# Patient Record
Sex: Female | Born: 1945 | Race: White | Hispanic: No | Marital: Married | Smoking: Never smoker
Health system: Southern US, Community
[De-identification: ages and names within clinical notes are randomized; demographics above are authoritative.]

## PROBLEM LIST (undated history)

## (undated) DIAGNOSIS — N200 Calculus of kidney: Secondary | ICD-10-CM

## (undated) DIAGNOSIS — R52 Pain, unspecified: Secondary | ICD-10-CM

## (undated) DIAGNOSIS — Z9889 Other specified postprocedural states: Secondary | ICD-10-CM

## (undated) DIAGNOSIS — E039 Hypothyroidism, unspecified: Secondary | ICD-10-CM

## (undated) DIAGNOSIS — K219 Gastro-esophageal reflux disease without esophagitis: Secondary | ICD-10-CM

## (undated) DIAGNOSIS — I10 Essential (primary) hypertension: Secondary | ICD-10-CM

## (undated) DIAGNOSIS — R112 Nausea with vomiting, unspecified: Secondary | ICD-10-CM

## (undated) DIAGNOSIS — Z87442 Personal history of urinary calculi: Secondary | ICD-10-CM

## (undated) DIAGNOSIS — E785 Hyperlipidemia, unspecified: Secondary | ICD-10-CM

## (undated) DIAGNOSIS — T4145XA Adverse effect of unspecified anesthetic, initial encounter: Secondary | ICD-10-CM

## (undated) DIAGNOSIS — N201 Calculus of ureter: Secondary | ICD-10-CM

## (undated) DIAGNOSIS — M1990 Unspecified osteoarthritis, unspecified site: Secondary | ICD-10-CM

## (undated) DIAGNOSIS — F419 Anxiety disorder, unspecified: Secondary | ICD-10-CM

## (undated) DIAGNOSIS — T8859XA Other complications of anesthesia, initial encounter: Secondary | ICD-10-CM

## (undated) HISTORY — PX: ABDOMINAL HYSTERECTOMY: SHX81

## (undated) HISTORY — PX: TUBAL LIGATION: SHX77

---
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
## 1976-02-08 HISTORY — PX: ECTOPIC PREGNANCY SURGERY: SHX613

## 1978-02-07 HISTORY — PX: DIAGNOSTIC LAPAROSCOPY: SUR761

## 1997-10-01 ENCOUNTER — Other Ambulatory Visit: Admission: RE | Admit: 1997-10-01 | Discharge: 1997-10-01 | Payer: Self-pay | Admitting: Obstetrics and Gynecology

## 1998-02-07 HISTORY — PX: TOTAL ABDOMINAL HYSTERECTOMY W/ BILATERAL SALPINGOOPHORECTOMY: SHX83

## 1998-05-04 ENCOUNTER — Inpatient Hospital Stay (HOSPITAL_COMMUNITY): Admission: RE | Admit: 1998-05-04 | Discharge: 1998-05-06 | Payer: Self-pay | Admitting: Obstetrics and Gynecology

## 1998-11-24 ENCOUNTER — Encounter: Payer: Self-pay | Admitting: Emergency Medicine

## 1998-11-24 ENCOUNTER — Emergency Department (HOSPITAL_COMMUNITY): Admission: EM | Admit: 1998-11-24 | Discharge: 1998-11-24 | Payer: Self-pay | Admitting: Emergency Medicine

## 2000-09-27 ENCOUNTER — Ambulatory Visit (HOSPITAL_COMMUNITY): Admission: RE | Admit: 2000-09-27 | Discharge: 2000-09-27 | Payer: Self-pay | Admitting: Family Medicine

## 2000-09-27 ENCOUNTER — Encounter: Payer: Self-pay | Admitting: Family Medicine

## 2001-03-19 ENCOUNTER — Encounter (HOSPITAL_COMMUNITY): Admission: RE | Admit: 2001-03-19 | Discharge: 2001-04-18 | Payer: Self-pay | Admitting: Orthopedic Surgery

## 2001-08-01 ENCOUNTER — Other Ambulatory Visit: Admission: RE | Admit: 2001-08-01 | Discharge: 2001-08-01 | Payer: Self-pay | Admitting: Family Medicine

## 2001-08-23 ENCOUNTER — Encounter: Payer: Self-pay | Admitting: Family Medicine

## 2001-08-23 ENCOUNTER — Observation Stay (HOSPITAL_COMMUNITY): Admission: AD | Admit: 2001-08-23 | Discharge: 2001-08-24 | Payer: Self-pay | Admitting: Family Medicine

## 2002-06-20 ENCOUNTER — Ambulatory Visit (HOSPITAL_COMMUNITY): Admission: RE | Admit: 2002-06-20 | Discharge: 2002-06-20 | Payer: Self-pay | Admitting: Family Medicine

## 2002-06-20 ENCOUNTER — Encounter: Payer: Self-pay | Admitting: Family Medicine

## 2003-01-09 ENCOUNTER — Encounter: Admission: RE | Admit: 2003-01-09 | Discharge: 2003-01-09 | Payer: Self-pay | Admitting: Obstetrics and Gynecology

## 2004-08-11 ENCOUNTER — Ambulatory Visit (HOSPITAL_COMMUNITY): Admission: RE | Admit: 2004-08-11 | Discharge: 2004-08-11 | Payer: Self-pay | Admitting: Family Medicine

## 2006-11-01 ENCOUNTER — Encounter (HOSPITAL_COMMUNITY): Admission: RE | Admit: 2006-11-01 | Discharge: 2006-11-07 | Payer: Self-pay | Admitting: Orthopedic Surgery

## 2006-11-08 ENCOUNTER — Encounter (HOSPITAL_COMMUNITY): Admission: RE | Admit: 2006-11-08 | Discharge: 2006-12-08 | Payer: Self-pay | Admitting: Orthopedic Surgery

## 2007-01-02 ENCOUNTER — Ambulatory Visit (HOSPITAL_COMMUNITY): Admission: RE | Admit: 2007-01-02 | Discharge: 2007-01-02 | Payer: Self-pay | Admitting: Family Medicine

## 2007-02-07 ENCOUNTER — Ambulatory Visit (HOSPITAL_COMMUNITY): Admission: RE | Admit: 2007-02-07 | Discharge: 2007-02-07 | Payer: Self-pay | Admitting: Family Medicine

## 2007-05-14 ENCOUNTER — Ambulatory Visit (HOSPITAL_COMMUNITY): Admission: RE | Admit: 2007-05-14 | Discharge: 2007-05-14 | Payer: Self-pay | Admitting: Family Medicine

## 2008-09-09 ENCOUNTER — Emergency Department (HOSPITAL_COMMUNITY): Admission: EM | Admit: 2008-09-09 | Discharge: 2008-09-09 | Payer: Self-pay | Admitting: Emergency Medicine

## 2008-09-15 ENCOUNTER — Ambulatory Visit (HOSPITAL_COMMUNITY): Admission: RE | Admit: 2008-09-15 | Discharge: 2008-09-15 | Payer: Self-pay | Admitting: Internal Medicine

## 2009-03-14 ENCOUNTER — Emergency Department (HOSPITAL_COMMUNITY): Admission: EM | Admit: 2009-03-14 | Discharge: 2009-03-14 | Payer: Self-pay | Admitting: Emergency Medicine

## 2010-02-28 ENCOUNTER — Emergency Department (HOSPITAL_COMMUNITY)
Admission: EM | Admit: 2010-02-28 | Discharge: 2010-02-28 | Payer: Self-pay | Source: Home / Self Care | Admitting: Emergency Medicine

## 2010-02-28 ENCOUNTER — Encounter: Payer: Self-pay | Admitting: Family Medicine

## 2010-03-01 ENCOUNTER — Encounter: Payer: Self-pay | Admitting: Internal Medicine

## 2010-03-02 LAB — HEMOGLOBIN AND HEMATOCRIT, BLOOD: Hemoglobin: 15.1 g/dL — ABNORMAL HIGH (ref 12.0–15.0)

## 2010-03-04 ENCOUNTER — Emergency Department (HOSPITAL_COMMUNITY)
Admission: EM | Admit: 2010-03-04 | Discharge: 2010-03-04 | Payer: Self-pay | Source: Home / Self Care | Admitting: Emergency Medicine

## 2010-06-25 NOTE — Discharge Summary (Signed)
   NAME:  Gina Branch, Gina Branch                           ACCOUNT NO.:  [id number]   MEDICAL RECORD NO.:  [id number]                   PATIENT TYPE:  INP   LOCATION:  A226                                 FACILITY:  [facility]   PHYSICIAN:  Corrie Mckusick, M.D.               DATE OF BIRTH:  February 17, 1945   DATE OF ADMISSION:  08/23/2001  DATE OF DISCHARGE:  08/24/2001                                 DISCHARGE SUMMARY   ADMITTING DIAGNOSIS:  Palpitations and weakness.   HISTORY OF PRESENTING ILLNESS/PAST MEDICAL HISTORY:  Please see admission  H&P.   HOSPITAL COURSE:  A 65 year old female admitted with palpitations to be  evaluated by [facility].  The patient, in 24 hours, had no  palpitations and felt quite well.  No chest pain and no shortness of breath.  CPK and troponins were negative x3.  __________ nuclear stress test set up  as an outpatient by [place].  She will wear a Holter event monitor as  an outpatient.  Cardiology's input greatly appreciated.  Prior to discharge  the patient stopped Lotrel and changed to Lotensin 10 mg daily, as well as  diltiazem 120 mg daily.  The patient felt well on discharge.   DISCHARGE PHYSICAL EXAMINATION:  Please see progress note on day of  discharge.                                               Corrie Mckusick, M.D.    Flint Melter  D:  09/27/2001  T:  09/27/2001  Job:  [id number]

## 2010-06-25 NOTE — H&P (Signed)
[facility]  Patient:    Gina Branch, Gina Branch Visit Number: [id number] MRN: [id number]          Service Type: MED Location: 2A A226 01 Attending Physician:  Colette Ribas Dictated by:   Elfredia Nevins, M.D. Admit Date:  08/23/2001 Discharge Date: 08/24/2001                           History and Physical  DATE OF BIRTH:  Sep 14, 1945  CHIEF COMPLAINT:  Palpitations and weakness.  HISTORY OF PRESENT ILLNESS:  The patient presents with multiple episodes over the past couple of weeks of having rapid palpitations described as post-exertional in onset.  She has had interspersed in this and possibly worsened during the episode is a vague chest discomfort described as a pressure similar to previous experience she has had with asthma.  She feels weak and drawn out and energy drained during these episodes but denies any diaphoresis, abrupt shortness of breath, visual changes, or true syncope.  She has had no hematemesis, hematochezia, or melena.  No abdominal pain.  PAST MEDICAL HISTORY: 1. Hypertension. 2. Asthma. 3. Hyperlipidemia. 4. Hypothyroidism.  PAST SURGICAL HISTORY:  Status post complete hysterectomy.  SOCIAL HISTORY:  She does not smoke or drink alcohol.  She uses no other illicit medications.  REVIEW OF SYSTEMS:  Negative in detail.  PHYSICAL EXAMINATION:  GENERAL:  She is awake, alert, and cooperative.  SKIN:  Remarkable for a recently partially removed seborrheic keratosis in the posterior thoracic area.  HEENT:  No JVD or adenopathy.  NECK:  Supple.  CHEST:  Clear to auscultation and percussion.  CARDIAC:  Regular rhythm at approximately 100 beats per minute.  Without murmur, gallop, or rub appreciated.  ABDOMEN:  Soft.  No organomegaly or masses.  EXTREMITIES:  Without clubbing, cyanosis, or edema.  NEUROLOGIC:  Nonfocal.  LABORATORY DATA:  EKG reveals sinus tachycardia with mild nonspecific ST and T-wave changes.   No acute ischemic or infarction changes noted.  Laboratories are pending as the patient is being admitted directly to the hospital from the office.  IMPRESSION: 1. Possible near syncope/hypotension with, by description, clinically appears    to be an supraventricular tachycardia although not caught on    electrocardiogram tracing. 2. Incomplete resection of a seborrheic keratosis. 3. Hypertension, with good control. 4. Asthma. 5. Hypothyroidism.  PLAN:  The patient will be admitted to rule out thyrotoxicosis, malignant ventricular as well as SVT.  Rule out MI protocol.  Incidental surgical consultation for complete resection of the seborrheic keratosis of the posterior thorax. Dictated by:   Elfredia Nevins, M.D. Attending Physician:  Colette Ribas DD:  08/23/01 TD:  08/27/01 Job: [id number]

## 2011-01-19 ENCOUNTER — Other Ambulatory Visit (HOSPITAL_COMMUNITY): Payer: Self-pay | Admitting: Physician Assistant

## 2011-01-19 ENCOUNTER — Ambulatory Visit (HOSPITAL_COMMUNITY)
Admission: RE | Admit: 2011-01-19 | Discharge: 2011-01-19 | Disposition: A | Discharge status: Home / Self Care | Payer: BC Managed Care – PPO | Source: Ambulatory Visit | Attending: Physician Assistant | Admitting: Physician Assistant

## 2011-01-19 DIAGNOSIS — J45909 Unspecified asthma, uncomplicated: Secondary | ICD-10-CM

## 2011-01-19 DIAGNOSIS — R059 Cough, unspecified: Secondary | ICD-10-CM | POA: Insufficient documentation

## 2011-01-19 DIAGNOSIS — R05 Cough: Secondary | ICD-10-CM | POA: Insufficient documentation

## 2011-05-20 ENCOUNTER — Emergency Department (HOSPITAL_COMMUNITY): Payer: Managed Care, Other (non HMO)

## 2011-05-20 ENCOUNTER — Emergency Department (HOSPITAL_COMMUNITY)
Admission: EM | Admit: 2011-05-20 | Discharge: 2011-05-20 | Disposition: A | Discharge status: Home / Self Care | Payer: Managed Care, Other (non HMO) | Attending: Emergency Medicine | Admitting: Emergency Medicine

## 2011-05-20 ENCOUNTER — Encounter (HOSPITAL_COMMUNITY): Payer: Self-pay | Admitting: Emergency Medicine

## 2011-05-20 DIAGNOSIS — I10 Essential (primary) hypertension: Secondary | ICD-10-CM | POA: Insufficient documentation

## 2011-05-20 DIAGNOSIS — E079 Disorder of thyroid, unspecified: Secondary | ICD-10-CM | POA: Insufficient documentation

## 2011-05-20 DIAGNOSIS — J45909 Unspecified asthma, uncomplicated: Secondary | ICD-10-CM | POA: Insufficient documentation

## 2011-05-20 DIAGNOSIS — I498 Other specified cardiac arrhythmias: Secondary | ICD-10-CM | POA: Insufficient documentation

## 2011-05-20 DIAGNOSIS — R1011 Right upper quadrant pain: Secondary | ICD-10-CM | POA: Insufficient documentation

## 2011-05-20 DIAGNOSIS — E876 Hypokalemia: Secondary | ICD-10-CM

## 2011-05-20 DIAGNOSIS — R079 Chest pain, unspecified: Secondary | ICD-10-CM | POA: Insufficient documentation

## 2011-05-20 DIAGNOSIS — K219 Gastro-esophageal reflux disease without esophagitis: Secondary | ICD-10-CM

## 2011-05-20 DIAGNOSIS — R110 Nausea: Secondary | ICD-10-CM | POA: Insufficient documentation

## 2011-05-20 DIAGNOSIS — R1110 Vomiting, unspecified: Secondary | ICD-10-CM | POA: Insufficient documentation

## 2011-05-20 DIAGNOSIS — E785 Hyperlipidemia, unspecified: Secondary | ICD-10-CM | POA: Insufficient documentation

## 2011-05-20 HISTORY — DX: Anxiety disorder, unspecified: F41.9

## 2011-05-20 HISTORY — DX: Hyperlipidemia, unspecified: E78.5

## 2011-05-20 HISTORY — DX: Essential (primary) hypertension: I10

## 2011-05-20 LAB — BASIC METABOLIC PANEL
BUN: 8 mg/dL (ref 6–23)
Chloride: 103 mEq/L (ref 96–112)
Creatinine, Ser: 0.7 mg/dL (ref 0.50–1.10)
GFR calc Af Amer: >90 mL/min (ref >90)
Potassium: 3.1 mEq/L — ABNORMAL LOW (ref 3.5–5.1)
Sodium: 143 mEq/L (ref 135–145)

## 2011-05-20 LAB — CBC
Hemoglobin: 16.7 g/dL — ABNORMAL HIGH (ref 12.0–15.0)
MCHC: 35.2 g/dL (ref 30.0–36.0)
Platelets: 232 10*3/uL (ref 150–400)
RDW: 12.3 % (ref 11.5–15.5)
WBC: 8.5 10*3/uL (ref 4.0–10.5)

## 2011-05-20 LAB — HEPATIC FUNCTION PANEL
ALT: 20 U/L (ref 0–35)
Alkaline Phosphatase: 91 U/L (ref 39–117)
Bilirubin, Direct: <0.1 mg/dL (ref 0.0–0.3)
Total Bilirubin: 0.4 mg/dL (ref 0.3–1.2)

## 2011-05-20 LAB — DIFFERENTIAL
Basophils Absolute: 0 10*3/uL (ref 0.0–0.1)
Eosinophils Absolute: 0.1 10*3/uL (ref 0.0–0.7)
Eosinophils Relative: 1 % (ref 0–5)
Lymphs Abs: 2.7 10*3/uL (ref 0.7–4.0)

## 2011-05-20 LAB — POCT I-STAT TROPONIN I: Troponin i, poc: 0 ng/mL (ref 0.00–0.08)

## 2011-05-20 MED ORDER — POTASSIUM CHLORIDE CRYS ER 20 MEQ PO TBCR
40.0000 meq | EXTENDED_RELEASE_TABLET | Freq: Once | ORAL | Status: AC
  Administered 2011-05-20: 40 meq via ORAL
  Filled 2011-05-20: qty 2

## 2011-05-20 MED ORDER — SODIUM CHLORIDE 0.9 % IV BOLUS (SEPSIS): 1000.0000 mL | Freq: Once | INTRAVENOUS | Status: DC

## 2011-05-20 MED ORDER — SODIUM CHLORIDE 0.9 % IV BOLUS (SEPSIS)
750.0000 mL | Freq: Once | INTRAVENOUS | Status: AC
  Administered 2011-05-20: 750 mL via INTRAVENOUS

## 2011-05-20 MED ORDER — PANTOPRAZOLE SODIUM 20 MG PO TBEC: DELAYED_RELEASE_TABLET | ORAL | Status: DC

## 2011-05-20 MED ORDER — POTASSIUM CHLORIDE CRYS ER 20 MEQ PO TBCR
20.0000 meq | EXTENDED_RELEASE_TABLET | Freq: Two times a day (BID) | ORAL | Status: DC
Start: 2011-05-20 — End: 2012-11-13

## 2011-05-20 NOTE — Discharge Instructions (Signed)
Your heart tests today were normal. You need to start a stomach acid medication such as Protonix or you can take Prilosec over-the-counter twice a day for the next 2 weeks then take once a day. You should talk to the gastroenterologist that  Did your recent colonoscopy on April 4 to see if you should have a endoscopy done to make sure you don't have any ulcer. You should also contact the cardiologist who did your stress tests a few years ago and see if he feels you should have another stress test done. Return to emergency department if you get worsening pressure chest pain again. Your potassium level tonight was 3.1 which is low. Take the potassium pills until gone. Have Dr. Phillips Odor recheck your potassium level again in about 2 weeks.

## 2011-05-20 NOTE — ED Notes (Signed)
Pt states she has been having problems with GERD for several weeks.  Pt states she has CP and SOB today with onset 40 minutes ago.  Pt with n/v as well. Pt states the CP is more of a tingling sensation.

## 2011-05-20 NOTE — ED Provider Notes (Signed)
History   This chart was scribed for Ward Givens, MD by Sofie Rower. The patient was seen in room APA06/[facility]06 and the patient's care was started at 1:44 PM     CSN: [id number]  Arrival date & time 05/20/11  1057   First MD Initiated Contact with Patient 05/20/11 1257      Chief Complaint  Patient presents with  . Shortness of Breath  . Chest Pain  . Gastrophageal Reflux    (Consider location/radiation/quality/duration/timing/severity/associated sxs/prior treatment) HPI  Gina Branch is a 66 y.o. female who presents to the Emergency Department complaining of moderate, intermittent Gastrophageal reflux onset two weeks ago with associated symptoms of vomiting, nausea, loss of appetite, burning in the chest and stomach. Pt states "when she eats it feels like she cant get anything down, and feels a strong urge to burp but she can't and I feel like if I threw up I would feel better." This has been helped with Gaviscon. She relates 5 days ago she can toe being then about 30 minutes later she had a severe burning in her chest and abdomen that was relieved with Gaviscon. She relates the following couple days she's had decreased appetite however she ate today and felt fine. Pt informs EDP that today, she had chest pain that "feels like a pressure in the middle of her chest." The pressurized feeling began at 10:30AM and went away at 11:10AM. The patient states "she was sitting at the computer reading emails when the pressure came on." Pt informs EDP that she feels "fine" now. Nothing makes it feel better or worse. She states she's never had this chest pain before. She denies radiation, dyspnea, or diaphoresis.  Pt has a hx of acid reflux, asthma,  laparotomy, cesarean section, laparoscopic procedure (tubes tied), hysterectomy, colonoscopy on 05/11/11 (2 polips benign) done at [facility], high blood pressure, familial hx of angioplasty (mother), familial hx of high blood pressure (mother), familial  hx of diabetes (mother), familial hx of CHF (father age 25), familial hx of cancer (sisters), and familial hx of COPD (sister).   Patient states she had a stress test at [facility] approximately 3 years ago that was fine.  Patient had a nosebleed in March and was seen by ENT at [facility] and it was treated with Bactroban ointment.  Pt denies sweats, cholecystectomy, appendectomy, positive stress test results (within the past three years). Pt states her husband's mother, her mothers twin sister and  her mother all passed away within a few months ago last fall. States she is under a lot of stress.  Pt is active and participates in aerobics. The last time the pt ate was 8:30AM.   PCP is Dr. Phillips Odor.   Past Medical History  Diagnosis Date  . Hypertension   . Hyperlipidemia   . Asthma   . Thyroid disease   . Anxiety     Family history The patient had a stroke, angioplasty, hypertension, diabetes. She died age 19 Other died age 8 he had congestive heart failure and was a smoker.  patient had a sister died from ovarian cancer at age 76 Patient had another sister who died of breast cancer at age 28. She also had COPD and a tracheostomy.    Past Surgical History  Procedure Date  . Ectopic pregnancy surgery   . Tubal ligation   . Abdominal hysterectomy   . Cesarean section       History  Substance Use Topics  . Smoking  status: Never Smoker   . Smokeless tobacco: Not on file  . Alcohol Use: No  lives at home with spouse retired  OB History    Grav Para Term Preterm Abortions TAB SAB Ect Mult Living                  Review of Systems  All other systems reviewed and are negative.    10 Systems reviewed and all are negative for acute change except as noted in the HPI.    Allergies  Sulfa antibiotics  Home Medications   Current Outpatient Rx  Name Route Sig Dispense Refill  . ASPIRIN EC 81 MG PO TBEC Oral Take 81 mg by mouth at bedtime.    Marland Kitchen BENAZEPRIL HCL 40  MG PO TABS Oral Take 40 mg by mouth at bedtime.    Marland Kitchen VITAMIN D 1000 UNITS PO TABS Oral Take 1,000 Units by mouth at bedtime.    . CO Q-10 200 MG PO CAPS Oral Take 1 capsule by mouth at bedtime.    Marland Kitchen DILTIAZEM HCL ER 180 MG PO CP24 Oral Take 180 mg by mouth daily.    Marland Kitchen ESCITALOPRAM OXALATE 10 MG PO TABS Oral Take 10 mg by mouth daily.    Marland Kitchen LEVOTHYROXINE SODIUM 100 MCG PO TABS Oral Take 100 mcg by mouth daily.    . ADULT MULTIVITAMIN W/MINERALS CH Oral Take 1 tablet by mouth at bedtime.    Marland Kitchen POLYETHYL GLYCOL-PROPYL GLYCOL 0.4-0.3 % OP SOLN Ophthalmic Apply 1 drop to eye 2 (two) times daily. OTC    Albuterol inhaler  BP 149/70  Pulse 73  Temp(Src) 98.2 F (36.8 C) (Oral)  Resp 20  Ht 5\' 2"  (1.575 m)  Wt 205 lb (92.987 kg)  BMI 37.49 kg/m2  SpO2 96%  Vital signs normal   Physical Exam  Nursing note and vitals reviewed. Constitutional: She is oriented to person, place, and time. She appears well-developed and well-nourished.  HENT:  Head: Normocephalic and atraumatic.  Right Ear: External ear normal.  Left Ear: External ear normal.  Nose: Nose normal.  Mouth/Throat: Oropharynx is clear and moist.  Eyes: Conjunctivae and EOM are normal. Pupils are equal, round, and reactive to light.  Neck: Normal range of motion. Neck supple.  Cardiovascular: Normal rate and normal heart sounds.  Exam reveals no gallop and no friction rub.   No murmur heard. Pulmonary/Chest: Effort normal and breath sounds normal. She has no wheezes. She has no rales.  Abdominal: Soft. Bowel sounds are normal. She exhibits no distension. There is tenderness. There is no rebound and no guarding.       Patient is tender diffusely her upper abdomen but is most tender in her epigastric region.  Musculoskeletal: Normal range of motion.  Neurological: She is alert and oriented to person, place, and time.  Skin: Skin is warm and dry.  Psychiatric: She has a normal mood and affect. Her behavior is normal.    ED Course    Procedures (including critical care time)   Medications  sodium chloride 0.9 % bolus 1,000 mL (not administered)  potassium chloride SA (K-DUR,KLOR-CON) CR tablet 40 mEq (not administered)  sodium chloride 0.9 % bolus 750 mL (750 mL Intravenous Given by Other 05/20/11 1718)     Pt remains pain free.   DIAGNOSTIC STUDIES: Oxygen Saturation is 96% on room air, adequate by my interpretation.    COORDINATION OF CARE:  Results for orders placed during the hospital encounter of 05/20/11  CBC      Component Value Range   WBC 8.5  4.0 - 10.5 (K/uL)   RBC 5.19 (*) 3.87 - 5.11 (MIL/uL)   Hemoglobin 16.7 (*) 12.0 - 15.0 (g/dL)   HCT 69.6 (*) 29.5 - 46.0 (%)   MCV 91.3  78.0 - 100.0 (fL)   MCH 32.2  26.0 - 34.0 (pg)   MCHC 35.2  30.0 - 36.0 (g/dL)   RDW 28.4  13.2 - 44.0 (%)   Platelets 232  150 - 400 (K/uL)  BASIC METABOLIC PANEL      Component Value Range   Sodium 143  135 - 145 (mEq/L)   Potassium 3.1 (*) 3.5 - 5.1 (mEq/L)   Chloride 103  96 - 112 (mEq/L)   CO2 25  19 - 32 (mEq/L)   Glucose, Bld 85  70 - 99 (mg/dL)   BUN 8  6 - 23 (mg/dL)   Creatinine, Ser 1.02  0.50 - 1.10 (mg/dL)   Calcium 72.5  8.4 - 10.5 (mg/dL)   GFR calc non Af Amer 90 (*) >90 (mL/min)   GFR calc Af Amer >90  >90 (mL/min)  TROPONIN I      Component Value Range   Troponin I <0.30  <0.30 (ng/mL)  LIPASE, BLOOD      Component Value Range   Lipase 43  11 - 59 (U/L)  HEPATIC FUNCTION PANEL      Component Value Range   Total Protein 7.7  6.0 - 8.3 (g/dL)   Albumin 4.7  3.5 - 5.2 (g/dL)   AST 20  0 - 37 (U/L)   ALT 20  0 - 35 (U/L)   Alkaline Phosphatase 91  39 - 117 (U/L)   Total Bilirubin 0.4  0.3 - 1.2 (mg/dL)   Bilirubin, Direct <3.6  0.0 - 0.3 (mg/dL)   Indirect Bilirubin NOT CALCULATED  0.3 - 0.9 (mg/dL)  DIFFERENTIAL      Component Value Range   Neutrophils Relative 59  43 - 77 (%)   Neutro Abs 5.0  1.7 - 7.7 (K/uL)   Lymphocytes Relative 32  12 - 46 (%)   Lymphs Abs 2.7  0.7 - 4.0 (K/uL)    Monocytes Relative 8  3 - 12 (%)   Monocytes Absolute 0.6  0.1 - 1.0 (K/uL)   Eosinophils Relative 1  0 - 5 (%)   Eosinophils Absolute 0.1  0.0 - 0.7 (K/uL)   Basophils Relative 1  0 - 1 (%)   Basophils Absolute 0.0  0.0 - 0.1 (K/uL)  POCT I-STAT TROPONIN I      Component Value Range   Troponin i, poc 0.00  0.00 - 0.08 (ng/mL)   Comment 3            Laboratory interpretation all normal except concentrated hemoglobin consistent with dehydration, hypokalemia   Chest Portable 1 View  05/20/2011  *RADIOLOGY REPORT*  Clinical Data: Pain, nausea  PORTABLE CHEST - 1 VIEW  Comparison: 01/19/2011  Findings: Cardiomediastinal silhouette is stable.  No acute infiltrate or pleural effusion.  No pulmonary edema.  Mild degenerative changes mid thoracic spine.  IMPRESSION: No active disease.  Original Report Authenticated By: Natasha Mead, M.D.   US Abdomen Limited Ruq  05/20/2011  *RADIOLOGY REPORT*  Clinical Data:  Right upper quadrant pain.  LIMITED ABDOMINAL ULTRASOUND - RIGHT UPPER QUADRANT  Comparison:  None.  Findings:  Gallbladder:  No stones or wall thickening.  Negative sonographic Murphy's.  Common bile duct:  Normal  caliber, 4 mm.  Liver:  Increased echotexture compatible with fatty infiltration. No focal abnormality or biliary ductal dilatation.  IMPRESSION: Mild fatty infiltration of the liver.  No acute findings.                   Original Report Authenticated By: Cyndie Chime, M.D.    Date: 05/20/2011  Rate: 104  Rhythm: sinus tachycardia  QRS Axis: normal  Intervals: normal  ST/T Wave abnormalities: nonspecific T wave changes  Conduction Disutrbances:none  Narrative Interpretation: low voltage QRS  Old EKG Reviewed: unchanged from 04/29/1998     1. Chest pain   2. GERD (gastroesophageal reflux disease)   3. Hypokalemia    New Prescriptions   PANTOPRAZOLE (PROTONIX) 20 MG TABLET    Take 1 po BID x 2 weeks then once a day   POTASSIUM CHLORIDE SA (K-DUR,KLOR-CON) 20 MEQ TABLET     Take 1 tablet (20 mEq total) by mouth 2 (two) times daily.   Plan discharge    Devoria Albe, MD, FACEP    MDM  I personally performed the services described in this documentation, which was scribed in my presence. The recorded information has been reviewed and considered. Devoria Albe, MD, Armando Gang          Ward Givens, MD 05/20/11 1816

## 2012-11-13 ENCOUNTER — Encounter (HOSPITAL_COMMUNITY): Payer: Self-pay | Admitting: Nurse Practitioner

## 2012-11-13 ENCOUNTER — Emergency Department (HOSPITAL_COMMUNITY)
Admission: EM | Admit: 2012-11-13 | Discharge: 2012-11-13 | Disposition: A | Discharge status: Home / Self Care | Payer: Medicare Other | Attending: Emergency Medicine | Admitting: Emergency Medicine

## 2012-11-13 ENCOUNTER — Emergency Department (HOSPITAL_COMMUNITY): Payer: Medicare Other

## 2012-11-13 DIAGNOSIS — E785 Hyperlipidemia, unspecified: Secondary | ICD-10-CM | POA: Insufficient documentation

## 2012-11-13 DIAGNOSIS — I10 Essential (primary) hypertension: Secondary | ICD-10-CM | POA: Insufficient documentation

## 2012-11-13 DIAGNOSIS — F411 Generalized anxiety disorder: Secondary | ICD-10-CM | POA: Insufficient documentation

## 2012-11-13 DIAGNOSIS — R61 Generalized hyperhidrosis: Secondary | ICD-10-CM | POA: Insufficient documentation

## 2012-11-13 DIAGNOSIS — E079 Disorder of thyroid, unspecified: Secondary | ICD-10-CM | POA: Insufficient documentation

## 2012-11-13 DIAGNOSIS — N132 Hydronephrosis with renal and ureteral calculous obstruction: Secondary | ICD-10-CM

## 2012-11-13 DIAGNOSIS — J45909 Unspecified asthma, uncomplicated: Secondary | ICD-10-CM | POA: Insufficient documentation

## 2012-11-13 DIAGNOSIS — Z79899 Other long term (current) drug therapy: Secondary | ICD-10-CM | POA: Insufficient documentation

## 2012-11-13 DIAGNOSIS — Z87442 Personal history of urinary calculi: Secondary | ICD-10-CM | POA: Insufficient documentation

## 2012-11-13 DIAGNOSIS — N1330 Unspecified hydronephrosis: Secondary | ICD-10-CM | POA: Insufficient documentation

## 2012-11-13 DIAGNOSIS — N201 Calculus of ureter: Secondary | ICD-10-CM | POA: Insufficient documentation

## 2012-11-13 DIAGNOSIS — R112 Nausea with vomiting, unspecified: Secondary | ICD-10-CM | POA: Insufficient documentation

## 2012-11-13 LAB — CBC WITH DIFFERENTIAL/PLATELET
Basophils Relative: 1 % (ref 0–1)
Eosinophils Absolute: 0.2 10*3/uL (ref 0.0–0.7)
Hemoglobin: 14.9 g/dL (ref 12.0–15.0)
MCH: 32.5 pg (ref 26.0–34.0)
MCHC: 36.1 g/dL — ABNORMAL HIGH (ref 30.0–36.0)
Monocytes Relative: 6 % (ref 3–12)
Neutrophils Relative %: 58 % (ref 43–77)

## 2012-11-13 LAB — COMPREHENSIVE METABOLIC PANEL
Albumin: 3.9 g/dL (ref 3.5–5.2)
Alkaline Phosphatase: 82 U/L (ref 39–117)
BUN: 10 mg/dL (ref 6–23)
Calcium: 9 mg/dL (ref 8.4–10.5)
Creatinine, Ser: 0.68 mg/dL (ref 0.50–1.10)
Potassium: 3.7 mEq/L (ref 3.5–5.1)
Total Protein: 6.5 g/dL (ref 6.0–8.3)

## 2012-11-13 LAB — URINE MICROSCOPIC-ADD ON

## 2012-11-13 LAB — LIPASE, BLOOD: Lipase: 39 U/L (ref 11–59)

## 2012-11-13 LAB — URINALYSIS, ROUTINE W REFLEX MICROSCOPIC
Nitrite: NEGATIVE
Specific Gravity, Urine: 1.026 (ref 1.005–1.030)
pH: 7.5 (ref 5.0–8.0)

## 2012-11-13 MED ORDER — OXYCODONE-ACETAMINOPHEN 5-325 MG PO TABS: 1.0000 | ORAL_TABLET | Freq: Once | ORAL | Status: DC

## 2012-11-13 MED ORDER — ONDANSETRON 4 MG PO TBDP: ORAL_TABLET | ORAL | Status: DC

## 2012-11-13 MED ORDER — TAMSULOSIN HCL 0.4 MG PO CAPS: ORAL_CAPSULE | ORAL | Status: DC

## 2012-11-13 MED ORDER — OXYCODONE-ACETAMINOPHEN 5-325 MG PO TABS: ORAL_TABLET | ORAL | Status: DC

## 2012-11-13 MED ORDER — SODIUM CHLORIDE 0.9 % IV SOLN
INTRAVENOUS | Status: DC
  Administered 2012-11-13: 13:00:00 via INTRAVENOUS

## 2012-11-13 MED ORDER — ONDANSETRON 4 MG PO TBDP: 4.0000 mg | ORAL_TABLET | Freq: Once | ORAL | Status: DC

## 2012-11-13 NOTE — ED Notes (Signed)
Pt knows that urine is needed. Pt is unable to void at this time.  

## 2012-11-13 NOTE — ED Notes (Signed)
Pt now reports a decrease in pain. States "that last little 'bout of pain I had I think pushed it through because I feel much better. "

## 2012-11-13 NOTE — ED Notes (Signed)
Contacted CT about wait time. Pt made aware that she is next in line.

## 2012-11-13 NOTE — ED Provider Notes (Signed)
CSN: [id number]     Arrival date & time 11/13/12  1015 History   First MD Initiated Contact with Patient 11/13/12 1109     Chief Complaint  Patient presents with  . Flank Pain   (Consider location/radiation/quality/duration/timing/severity/associated sxs/prior Treatment) HPI  Patient reports she's had renal stones in the past at least 3-4 times. She reports she started seeing blood in her urine on September 23. She went to see her gynecologist to send off a urine sample and urine culture that was negative. She had been placed on Cipro x3 days. She reports she has noticed after she does her aerobic exercise routine she sees more blood, and she states she sees blood in the toilet water and also when she wipes. She states it did go away however it did return on October 4. She states it burns when she urinates. She had another appointment today to see her GYN. She states today at about 9:30 AM she started having pain in her left flank that radiates into her left upper and left lower quadrant. She states it's a dull aching pain and at times he gets stabbing at which point she gets diaphoretic and nauseated. She denies any fever. She has had nausea with vomiting this morning. She states her current pain is a 6/10 and at its worst it was a 10 out of 10. She reports she has had kidney stones in the past and this feels similar.  PCP Dr Phillips Odor Urologist Dr Vernie Ammons  Past Medical History  Diagnosis Date  . Hypertension   . Hyperlipidemia   . Asthma   . Thyroid disease   . Anxiety   . Kidney stones    Past Surgical History  Procedure Laterality Date  . Ectopic pregnancy surgery    . Tubal ligation    . Abdominal hysterectomy    . Cesarean section     History reviewed. No pertinent family history. History  Substance Use Topics  . Smoking status: Never Smoker   . Smokeless tobacco: Not on file  . Alcohol Use: No  lives at home Lives with spouse  OB History   Grav Para Term Preterm  Abortions TAB SAB Ect Mult Living                 Review of Systems  All other systems reviewed and are negative.    Allergies  Sulfa antibiotics  Home Medications   Current Outpatient Rx  Name  Route  Sig  Dispense  Refill  . albuterol (PROVENTIL HFA;VENTOLIN HFA) 108 (90 BASE) MCG/ACT inhaler   Inhalation   Inhale 2 puffs into the lungs every 6 (six) hours as needed for wheezing.          Marland Kitchen aspirin EC 81 MG tablet   Oral   Take 81 mg by mouth at bedtime.         Marland Kitchen atorvastatin (LIPITOR) 10 MG tablet   Oral   Take 10 mg by mouth every morning.         . cholecalciferol (VITAMIN D) 1000 UNITS tablet   Oral   Take 1,000 Units by mouth at bedtime.         . Coenzyme Q10 (CO Q-10) 200 MG CAPS   Oral   Take 1 capsule by mouth at bedtime.         Marland Kitchen diltiazem (DILACOR XR) 180 MG 24 hr capsule   Oral   Take 180 mg by mouth daily.         Marland Kitchen  escitalopram (LEXAPRO) 10 MG tablet   Oral   Take 10 mg by mouth daily.         . fish oil-omega-3 fatty acids 1000 MG capsule   Oral   Take 1 g by mouth at bedtime.         Marland Kitchen levothyroxine (SYNTHROID, LEVOTHROID) 100 MCG tablet   Oral   Take 100 mcg by mouth daily.         . Multiple Vitamin (MULITIVITAMIN WITH MINERALS) TABS   Oral   Take 1 tablet by mouth at bedtime.         Bertram Gala Glycol-Propyl Glycol (SYSTANE PRESERVATIVE FREE) 0.4-0.3 % SOLN   Ophthalmic   Apply 1 drop to eye 2 (two) times daily. OTC         . vitamin C (ASCORBIC ACID) 500 MG tablet   Oral   Take 1,000 mg by mouth at bedtime.         . ondansetron (ZOFRAN ODT) 4 MG disintegrating tablet      Take 1 or 2 po Q 6hrs for nausea or vomiting   20 tablet   0   . oxyCODONE-acetaminophen (ROXICET) 5-325 MG per tablet      Take 1 or 2 po Q 6hrs for pain   30 tablet   0   . tamsulosin (FLOMAX) 0.4 MG CAPS capsule      Take 1 po QD until you pass the stone.   10 capsule   0    BP 159/64  Pulse 66  Temp(Src) 97.9 F  (36.6 C) (Oral)  Resp 20  Ht 5\' 3"  (1.6 m)  SpO2 96%  Vital signs normal   Physical Exam  Nursing note and vitals reviewed. Constitutional: She is oriented to person, place, and time. She appears well-developed and well-nourished.  Non-toxic appearance. She does not appear ill. No distress.  HENT:  Head: Normocephalic and atraumatic.  Right Ear: External ear normal.  Left Ear: External ear normal.  Nose: Nose normal. No mucosal edema or rhinorrhea.  Mouth/Throat: Oropharynx is clear and moist and mucous membranes are normal. No dental abscesses or edematous.  Eyes: Conjunctivae and EOM are normal. Pupils are equal, round, and reactive to light.  Neck: Normal range of motion and full passive range of motion without pain. Neck supple.  Cardiovascular: Normal rate, regular rhythm and normal heart sounds.  Exam reveals no gallop and no friction rub.   No murmur heard. Pulmonary/Chest: Effort normal and breath sounds normal. No respiratory distress. She has no wheezes. She has no rhonchi. She has no rales. She exhibits no tenderness and no crepitus.  Abdominal: Soft. Normal appearance and bowel sounds are normal. She exhibits no distension. There is no tenderness. There is no rebound and no guarding.    Area of pain noted  Musculoskeletal: Normal range of motion. She exhibits no edema and no tenderness.       Back:  Areaof pain noted, but is nontender to palpation.Moves all extremities well.   Neurological: She is alert and oriented to person, place, and time. She has normal strength. No cranial nerve deficit.  Skin: Skin is warm, dry and intact. No rash noted. No erythema. No pallor.  Psychiatric: She has a normal mood and affect. Her speech is normal and behavior is normal. Her mood appears not anxious.    ED Course  Procedures (including critical care time)  Medications  0.9 %  sodium chloride infusion ( Intravenous Stopped 11/13/12 1432)  oxyCODONE-acetaminophen  (PERCOCET/ROXICET) 5-325 MG per tablet 1 tablet (not administered)  ondansetron (ZOFRAN-ODT) disintegrating tablet 4 mg (not administered)    Pt refused pain medications when first seen.   Review of her prior tests show she did have a CT of her and pelvis done in the year 2000. At that point she had 2 punctate calcifications in her left kidney, none on her right.  Pt given results of her CT scan, she states her pain isn't bad right now. We discussed she might need a stent or lithotripsy for this stone to be passed.   15:03 Dr Berneice Heinrich, reviewed her scan, feels since she is comfortable can try outpatient follow up, he will get her an appointment this week, to return if she gets worse.   Pt states her pain is a 3/10 at discharge. She was given discussion with Dr Berneice Heinrich. She is refusing IV pain medications but is agreeable to take an oral pain med before leaving. She was given precautions to return to [facility] ED.   Labs Review Results for orders placed during the hospital encounter of 11/13/12  CBC WITH DIFFERENTIAL      Result Value Range   WBC 5.8  4.0 - 10.5 K/uL   RBC 4.59  3.87 - 5.11 MIL/uL   Hemoglobin 14.9  12.0 - 15.0 g/dL   HCT 16.1  09.6 - 04.5 %   MCV 90.0  78.0 - 100.0 fL   MCH 32.5  26.0 - 34.0 pg   MCHC 36.1 (*) 30.0 - 36.0 g/dL   RDW 40.9  81.1 - 91.4 %   Platelets 182  150 - 400 K/uL   Neutrophils Relative % 58  43 - 77 %   Neutro Abs 3.4  1.7 - 7.7 K/uL   Lymphocytes Relative 32  12 - 46 %   Lymphs Abs 1.9  0.7 - 4.0 K/uL   Monocytes Relative 6  3 - 12 %   Monocytes Absolute 0.3  0.1 - 1.0 K/uL   Eosinophils Relative 3  0 - 5 %   Eosinophils Absolute 0.2  0.0 - 0.7 K/uL   Basophils Relative 1  0 - 1 %   Basophils Absolute 0.1  0.0 - 0.1 K/uL  COMPREHENSIVE METABOLIC PANEL      Result Value Range   Sodium 141  135 - 145 mEq/L   Potassium 3.7  3.5 - 5.1 mEq/L   Chloride 104  96 - 112 mEq/L   CO2 26  19 - 32 mEq/L   Glucose, Bld 130 (*) 70 - 99 mg/dL   BUN 10  6 - 23  mg/dL   Creatinine, Ser 7.82  0.50 - 1.10 mg/dL   Calcium 9.0  8.4 - 95.6 mg/dL   Total Protein 6.5  6.0 - 8.3 g/dL   Albumin 3.9  3.5 - 5.2 g/dL   AST 20  0 - 37 U/L   ALT 21  0 - 35 U/L   Alkaline Phosphatase 82  39 - 117 U/L   Total Bilirubin 0.4  0.3 - 1.2 mg/dL   GFR calc non Af Amer 89 (*) >90 mL/min   GFR calc Af Amer >90  >90 mL/min  LIPASE, BLOOD      Result Value Range   Lipase 39  11 - 59 U/L  URINALYSIS, ROUTINE W REFLEX MICROSCOPIC      Result Value Range   Color, Urine YELLOW  YELLOW   APPearance TURBID (*) CLEAR   Specific Gravity, Urine  1.026  1.005 - 1.030   pH 7.5  5.0 - 8.0   Glucose, UA NEGATIVE  NEGATIVE mg/dL   Hgb urine dipstick LARGE (*) NEGATIVE   Bilirubin Urine NEGATIVE  NEGATIVE   Ketones, ur 15 (*) NEGATIVE mg/dL   Protein, ur 30 (*) NEGATIVE mg/dL   Urobilinogen, UA 1.0  0.0 - 1.0 mg/dL   Nitrite NEGATIVE  NEGATIVE   Leukocytes, UA TRACE (*) NEGATIVE  URINE MICROSCOPIC-ADD ON      Result Value Range   WBC, UA 0-2  <3 WBC/hpf   RBC / HPF 21-50  <3 RBC/hpf   Bacteria, UA FEW (*) RARE   Urine-Other AMORPHOUS URATES/PHOSPHATES     Laboratory interpretation all normal except +urates in urine c/w uric acid stones    Imaging Review Ct Abdomen Pelvis Wo Contrast  11/13/2012   CLINICAL DATA:  Hematuria, left flank pain  EXAM: CT ABDOMEN AND PELVIS WITHOUT CONTRAST  TECHNIQUE: Multidetector CT imaging of the abdomen and pelvis was performed following the standard protocol without intravenous contrast.  COMPARISON:  Report 11/24/1998 no images available  FINDINGS: Lung bases shows bilateral linear atelectasis or scarring.  Sagittal images of the spine shows degenerative changes lumbar spine. Multi level disc space flattening with vacuum disc phenomenon.  Unenhanced liver shows no biliary ductal dilatation. No calcified gallstones are noted within gallbladder.  Unenhanced pancreas, spleen and adrenals are unremarkable. There is mild left hydronephrosis and  mild proximal left hydroureter mild left perinephric stranding. At least 4 nonobstructive calcified calculi are noted within left kidney the largest in midpole measures 10 mm. Lower pole calcified calculus left kidney measures 7.5 mm. No right renal calcifications are identified.  In sagittal image 84 and axial image 43 there is calcified obstructive calculus in proximal left ureter measures about 6 mm at the level of lower endplate of L3 vertebral body.  No small bowel obstruction. No ascites or free air. No adenopathy.  No right hydroureter. Bilateral distal ureter is unremarkable. No calcified calculi are noted within urinary bladder. The patient is status post hysterectomy. No aortic aneurysm. No small bowel obstruction.  Degenerative changes bilateral SI joints. No pericecal inflammation. The terminal ileum is unremarkable. Normal appendix partially visualized in axial image 54.  IMPRESSION: 1. There is mild left hydronephrosis and proximal left hydroureter. Mild left perinephric stranding. There is 6 mm calcified obstructive calculus in proximal left ureter at the level of lower endplate of L3 vertebral body. 2. Left nonobstructive nephrolithiasis. Largest nonobstructive calcified calculus in midpole of the left kidney measures about 1 cm. 3. No right nephrolithiasis. 4. Status post hysterectomy. 5. Degenerative changes lumbar spine.   Electronically Signed   By: Natasha Mead M.D.   On: 11/13/2012 14:40    MDM   1. Ureteral stone with hydronephrosis     New Prescriptions   ONDANSETRON (ZOFRAN ODT) 4 MG DISINTEGRATING TABLET    Take 1 or 2 po Q 6hrs for nausea or vomiting   OXYCODONE-ACETAMINOPHEN (ROXICET) 5-325 MG PER TABLET    Take 1 or 2 po Q 6hrs for pain   TAMSULOSIN (FLOMAX) 0.4 MG CAPS CAPSULE    Take 1 po QD until you pass the stone.     Plan discharge   Devoria Albe, MD, Franz Dell, MD 11/13/12 1524

## 2012-11-13 NOTE — ED Notes (Signed)
Pt states that she isn't having any pain or nausea at this time, so doesn't want to take RX now.

## 2012-11-13 NOTE — ED Notes (Signed)
MD Knapp at bedside 

## 2012-11-13 NOTE — ED Notes (Signed)
PT reports LLQ pain since AM. Pt reports hx kidney stones, states this feels similar. Pt denies pain on palpation or radiation. Reports nausea with "shooting pains" but denies emesis.

## 2012-11-13 NOTE — ED Notes (Signed)
Pt reports hematuria for several weeks, was tested for UTI by OBGYN that was negative. This am she started to have severe L flank and abd pain with diaphoresis and nausea, states "feels like when i had a kidney stone." hematuria continues.

## 2012-11-13 NOTE — ED Notes (Signed)
MD at bedside. 

## 2012-12-03 ENCOUNTER — Other Ambulatory Visit: Payer: Self-pay | Admitting: Urology

## 2012-12-06 ENCOUNTER — Encounter (HOSPITAL_BASED_OUTPATIENT_CLINIC_OR_DEPARTMENT_OTHER): Payer: Self-pay | Admitting: *Deleted

## 2012-12-07 ENCOUNTER — Encounter (HOSPITAL_BASED_OUTPATIENT_CLINIC_OR_DEPARTMENT_OTHER): Payer: Self-pay | Admitting: *Deleted

## 2012-12-07 NOTE — Progress Notes (Signed)
12/07/12 1251  OBSTRUCTIVE SLEEP APNEA  Have you ever been diagnosed with sleep apnea through a sleep study? No  Do you snore loudly (loud enough to be heard through closed doors)?  1  Do you often feel tired, fatigued, or sleepy during the daytime? 0  Has anyone observed you stop breathing during your sleep? 0  Do you have, or are you being treated for high blood pressure? 1  BMI more than 35 kg/m2? 1  Age over 66 years old? 1  Gender: 0  Obstructive Sleep Apnea Score 4

## 2012-12-07 NOTE — Progress Notes (Signed)
NPO AFTER MN. ARRIVE AT 0600. NEEDS KUB, ISTAT, AND EKG. WILL TAKE DILTIAZEM, LIPITOR, LEXAPRO, AND SYNTHROID AM DOS W/ SIPS OF WATER. AND IF NEEDED MAY TAKE OXYCODONE AND ZOFRAN.

## 2012-12-11 NOTE — H&P (Signed)
History of Present Illness     Left nephrolithiasis: A CT scan done 11/14/38 revealed 4 stones in her left kidney the largest measuring 10 mm in the lower pole.  All were nonobstructing with Hounsfield units of ~800.  In addition there was a 4 mm wide stone just below the UPJ on the left side.  The radiologist read this as 6 mm however that his length not the width of the stone. Metabolic workup: Serum studies revealed no abnormality other than a significantly elevated PTH of 103.3 with a normal serum calcium of 9.3.    Interval history: She was found to have a 4 mm stone in her proximal left ureter as well as non-obstructing left renal calculi.  She was placed on medical expulsive therapy. She has not passed her stone but continues to have intermittent left flank pain. It is requiring narcotic analgesics. She also has developed constipation   Past Medical History Problems  1. History of  Hyperlipidemia 272.4 2. History of  Hypertension 401.9 3. History of  Hypothyroidism 244.9  Surgical History Problems  1. History of  Cesarean Section 2. History of  Excision Of Bartholin's Gland Or Cyst 3. History of  Exploratory Laparoscopy 4. History of  Exploratory Laparotomy 5. History of  Hysterectomy V45.77 6. History of  Tubal Ligation V25.2  Current Meds 1. Albuterol Sulfate (2.5 MG/3ML) 0.083% Inhalation Nebulization Solution; Therapy:  (Recorded:09Oct2014) to 2. Aspir-81 81 MG Oral Tablet Delayed Release; Therapy: (Recorded:09Oct2014) to 3. CoQ10 200 MG Oral Capsule; Therapy: (Recorded:09Oct2014) to 4. Diltiazem HCl ER 180 MG Oral Capsule Extended Release 24 Hour; Therapy:  (Recorded:09Oct2014) to 5. Fish Oil Concentrate 1000 MG Oral Capsule; Therapy: (Recorded:09Oct2014) to 6. Lexapro 10 MG Oral Tablet; Therapy: (Recorded:09Oct2014) to 7. Lipitor 10 MG Oral Tablet; Therapy: (Recorded:09Oct2014) to 8. Ondansetron HCl 4 MG Oral Tablet; Therapy: (Recorded:09Oct2014) to 9. Roxicet 5-325 MG  Oral Tablet; Therapy: (Recorded:09Oct2014) to 10. Synthroid 100 MCG Oral Tablet; Therapy: (Recorded:09Oct2014) to 11. Tamsulosin HCl 0.4 MG Oral Capsule; Therapy: (Recorded:09Oct2014) to 12. Vitamin C 1000 MG Oral Tablet; Therapy: (Recorded:09Oct2014) to 13. Vitamin D 1000 UNIT Oral Tablet; Therapy: (Recorded:09Oct2014) to  Allergies Medication  1. Sulfa Drugs  Family History Problems  1. Family history of  Diabetes Mellitus V18.0 2. Family history of  Hyperlipidemia 3. Family history of  Hypertension V17.49 4. Maternal uncle's history of  Urinary Calculus  Social History Problems  1. Marital History - Currently Married 2. Never A Smoker Denied  3. History of  Alcohol Use   Review of Systems Genitourinary, constitutional, skin, eye, otolaryngeal, hematologic/lymphatic, cardiovascular, pulmonary, endocrine, musculoskeletal, gastrointestinal, neurological and psychiatric system(s) were reviewed and pertinent findings if present are noted.  Genitourinary: urinary urgency, nocturia, incontinence and hematuria.  Gastrointestinal: nausea, vomiting and heartburn.  ENT: sinus problems.  Hematologic/Lymphatic: a tendency to easily bruise.  Musculoskeletal: back pain and joint pain.  Neurological: headache.  Psychiatric: anxiety.    Vitals Vital Signs  Blood Pressure: 141 / 85 Temperature: 99.2 F Heart Rate: 102  Height: 5 ft 3 in Patient Refused Weight: Yes  Physical Exam Constitutional: Well nourished and well developed . No acute distress.  ENT:. The ears and nose are normal in appearance.  Neck: The appearance of the neck is normal and no neck mass is present.  Pulmonary: No respiratory distress and normal respiratory rhythm and effort.  Cardiovascular: Heart rate and rhythm are normal . No peripheral edema.  Abdomen: The abdomen is mildly obese. The abdomen is soft and nontender.  No masses are palpated. No CVA tenderness. No hernias are palpable. No hepatosplenomegaly  noted.  Lymphatics: The femoral and inguinal nodes are not enlarged or tender.  Skin: Normal skin turgor, no visible rash and no visible skin lesions.  Neuro/Psych:. Mood and affect are appropriate.     Assessment Assessed  1. Proximal Ureteral Stone On The Left 592.1 2. Nephrolithiasis Of The Left Kidney 592.0 3. Considered  Hyperparathyroidism 252.00   Although her KUB today reveals significant colonic gas and bowel contents which are obscuring the upper portion and middle portion of the left ureter I cannot see a stone overlying the distal ureter and am unable to tell if there is a stone in the location where it was noted previously. She continues to have pain. We discussed the fact that some of this could be do to her distended colon which I see on her KUB today but I think this is primarily due to a stone. We therefore discussed proceeding with ureteroscopy since I think it would be difficult to see her stone with lithotripsy. She is in agreement with that and plans to proceed with ureteroscopic extraction.       Plan    1. I refilled her pain medication prescription and gave her more tamsulosin for medical expulsive therapy. 2. She will be scheduled for left ureteroscopy and stone extraction with possible laser lithotripsy.

## 2012-12-13 ENCOUNTER — Encounter (HOSPITAL_BASED_OUTPATIENT_CLINIC_OR_DEPARTMENT_OTHER): Payer: Self-pay | Admitting: Anesthesiology

## 2012-12-13 NOTE — Anesthesia Preprocedure Evaluation (Addendum)
Anesthesia Evaluation  Patient identified by MRN, date of birth, ID band Patient awake    Reviewed: Allergy & Precautions, H&P , NPO status , Patient's Chart, lab work & pertinent test results  History of Anesthesia Complications (+) PONV and history of anesthetic complications  Airway Mallampati: II TM Distance: >3 FB Neck ROM: Full    Dental no notable dental hx.    Pulmonary asthma ,  breath sounds clear to auscultation  Pulmonary exam normal       Cardiovascular Exercise Tolerance: Good hypertension, Pt. on medications negative cardio ROS  Rhythm:Regular Rate:Normal  Most recent ECG and CXR reviewed.  Very active. Walk aerobics and resistance training five days a week.   Neuro/Psych Anxiety negative neurological ROS     GI/Hepatic Neg liver ROS, GERD-  Medicated,  Endo/Other  Hypothyroidism   Renal/GU Renal disease  negative genitourinary   Musculoskeletal negative musculoskeletal ROS (+)   Abdominal (+) + obese,   Peds negative pediatric ROS (+)  Hematology negative hematology ROS (+)   Anesthesia Other Findings   Reproductive/Obstetrics negative OB ROS                         Anesthesia Physical Anesthesia Plan  ASA: II  Anesthesia Plan: General   Post-op Pain Management:    Induction: Intravenous  Airway Management Planned: LMA  Additional Equipment:   Intra-op Plan:   Post-operative Plan: Extubation in OR  Informed Consent: I have reviewed the patients History and Physical, chart, labs and discussed the procedure including the risks, benefits and alternatives for the proposed anesthesia with the patient or authorized representative who has indicated his/her understanding and acceptance.   Dental advisory given  Plan Discussed with: CRNA  Anesthesia Plan Comments:         Anesthesia Quick Evaluation

## 2012-12-14 ENCOUNTER — Ambulatory Visit (HOSPITAL_BASED_OUTPATIENT_CLINIC_OR_DEPARTMENT_OTHER)
Admission: RE | Admit: 2012-12-14 | Discharge: 2012-12-14 | Disposition: A | Discharge status: Home / Self Care | Payer: Medicare Other | Source: Ambulatory Visit | Attending: Urology | Admitting: Urology

## 2012-12-14 ENCOUNTER — Encounter (HOSPITAL_BASED_OUTPATIENT_CLINIC_OR_DEPARTMENT_OTHER): Payer: Self-pay | Admitting: *Deleted

## 2012-12-14 ENCOUNTER — Ambulatory Visit (HOSPITAL_COMMUNITY): Payer: Medicare Other

## 2012-12-14 ENCOUNTER — Encounter (HOSPITAL_BASED_OUTPATIENT_CLINIC_OR_DEPARTMENT_OTHER): Payer: Medicare Other | Admitting: Anesthesiology

## 2012-12-14 ENCOUNTER — Encounter (HOSPITAL_BASED_OUTPATIENT_CLINIC_OR_DEPARTMENT_OTHER)
Admission: RE | Disposition: A | Discharge status: Home / Self Care | Payer: Self-pay | Source: Ambulatory Visit | Attending: Urology

## 2012-12-14 ENCOUNTER — Ambulatory Visit (HOSPITAL_BASED_OUTPATIENT_CLINIC_OR_DEPARTMENT_OTHER): Payer: Medicare Other | Admitting: Anesthesiology

## 2012-12-14 DIAGNOSIS — N201 Calculus of ureter: Secondary | ICD-10-CM

## 2012-12-14 DIAGNOSIS — I10 Essential (primary) hypertension: Secondary | ICD-10-CM | POA: Insufficient documentation

## 2012-12-14 DIAGNOSIS — K5900 Constipation, unspecified: Secondary | ICD-10-CM | POA: Insufficient documentation

## 2012-12-14 DIAGNOSIS — J45909 Unspecified asthma, uncomplicated: Secondary | ICD-10-CM | POA: Insufficient documentation

## 2012-12-14 DIAGNOSIS — Z79899 Other long term (current) drug therapy: Secondary | ICD-10-CM | POA: Insufficient documentation

## 2012-12-14 DIAGNOSIS — E669 Obesity, unspecified: Secondary | ICD-10-CM | POA: Insufficient documentation

## 2012-12-14 DIAGNOSIS — N200 Calculus of kidney: Secondary | ICD-10-CM | POA: Insufficient documentation

## 2012-12-14 DIAGNOSIS — E039 Hypothyroidism, unspecified: Secondary | ICD-10-CM | POA: Insufficient documentation

## 2012-12-14 DIAGNOSIS — E785 Hyperlipidemia, unspecified: Secondary | ICD-10-CM | POA: Insufficient documentation

## 2012-12-14 DIAGNOSIS — K219 Gastro-esophageal reflux disease without esophagitis: Secondary | ICD-10-CM | POA: Insufficient documentation

## 2012-12-14 HISTORY — DX: Calculus of ureter: N20.1

## 2012-12-14 HISTORY — DX: Nausea with vomiting, unspecified: R11.2

## 2012-12-14 HISTORY — DX: Personal history of urinary calculi: Z87.442

## 2012-12-14 HISTORY — DX: Adverse effect of unspecified anesthetic, initial encounter: T41.45XA

## 2012-12-14 HISTORY — DX: Gastro-esophageal reflux disease without esophagitis: K21.9

## 2012-12-14 HISTORY — DX: Other complications of anesthesia, initial encounter: T88.59XA

## 2012-12-14 HISTORY — PX: CYSTOSCOPY WITH URETEROSCOPY AND STENT PLACEMENT: SHX6377

## 2012-12-14 HISTORY — DX: Other specified postprocedural states: Z98.890

## 2012-12-14 HISTORY — DX: Calculus of kidney: N20.0

## 2012-12-14 HISTORY — DX: Unspecified osteoarthritis, unspecified site: M19.90

## 2012-12-14 HISTORY — PX: CYSTOSCOPY W/ RETROGRADES: SHX1426

## 2012-12-14 HISTORY — DX: Hypothyroidism, unspecified: E03.9

## 2012-12-14 LAB — POCT I-STAT 4, (NA,K, GLUC, HGB,HCT)
Glucose, Bld: 112 mg/dL — ABNORMAL HIGH (ref 70–99)
HCT: 45 % (ref 36.0–46.0)
Hemoglobin: 15.3 g/dL — ABNORMAL HIGH (ref 12.0–15.0)
Potassium: 3.6 mEq/L (ref 3.5–5.1)
Sodium: 143 mEq/L (ref 135–145)

## 2012-12-14 SURGERY — CYSTOSCOPY, WITH RETROGRADE PYELOGRAM
Anesthesia: General | Site: Ureter | Laterality: Left | Wound class: Clean Contaminated

## 2012-12-14 MED ORDER — TAMSULOSIN HCL 0.4 MG PO CAPS
0.4000 mg | ORAL_CAPSULE | Freq: Once | ORAL | Status: AC
  Administered 2012-12-14: 0.4 mg via ORAL
  Filled 2012-12-14: qty 1

## 2012-12-14 MED ORDER — ONDANSETRON HCL 4 MG/2ML IJ SOLN
INTRAMUSCULAR | Status: DC | PRN
  Administered 2012-12-14: 4 mg via INTRAVENOUS

## 2012-12-14 MED ORDER — CIPROFLOXACIN IN D5W 200 MG/100ML IV SOLN
200.0000 mg | INTRAVENOUS | Status: AC
  Administered 2012-12-14: 200 mg via INTRAVENOUS
  Filled 2012-12-14: qty 100

## 2012-12-14 MED ORDER — OXYBUTYNIN CHLORIDE 5 MG PO TABS
5.0000 mg | ORAL_TABLET | Freq: Once | ORAL | Status: AC
  Administered 2012-12-14: 5 mg via ORAL
  Filled 2012-12-14: qty 1

## 2012-12-14 MED ORDER — SODIUM CHLORIDE 0.9 % IR SOLN
Status: DC | PRN
  Administered 2012-12-14: 6000 mL

## 2012-12-14 MED ORDER — KETOROLAC TROMETHAMINE 30 MG/ML IJ SOLN
INTRAMUSCULAR | Status: DC | PRN
  Administered 2012-12-14: 30 mg via INTRAVENOUS

## 2012-12-14 MED ORDER — EPHEDRINE SULFATE 50 MG/ML IJ SOLN
INTRAMUSCULAR | Status: DC | PRN
  Administered 2012-12-14: 10 mg via INTRAVENOUS

## 2012-12-14 MED ORDER — MIDAZOLAM HCL 5 MG/5ML IJ SOLN
INTRAMUSCULAR | Status: DC | PRN
  Administered 2012-12-14: 1 mg via INTRAVENOUS

## 2012-12-14 MED ORDER — PHENAZOPYRIDINE HCL 200 MG PO TABS
200.0000 mg | ORAL_TABLET | Freq: Once | ORAL | Status: AC
  Administered 2012-12-14: 200 mg via ORAL
  Filled 2012-12-14: qty 1

## 2012-12-14 MED ORDER — PHENAZOPYRIDINE HCL 200 MG PO TABS: 200.0000 mg | ORAL_TABLET | Freq: Three times a day (TID) | ORAL | Status: AC | PRN

## 2012-12-14 MED ORDER — OXYCODONE-ACETAMINOPHEN 10-325 MG PO TABS: 1.0000 | ORAL_TABLET | ORAL | Status: DC | PRN

## 2012-12-14 MED ORDER — LIDOCAINE HCL (CARDIAC) 20 MG/ML IV SOLN
INTRAVENOUS | Status: DC | PRN
  Administered 2012-12-14: 60 mg via INTRAVENOUS

## 2012-12-14 MED ORDER — IOHEXOL 350 MG/ML SOLN
INTRAVENOUS | Status: DC | PRN
  Administered 2012-12-14: 4 mL via INTRAVENOUS

## 2012-12-14 MED ORDER — GLYCOPYRROLATE 0.2 MG/ML IJ SOLN
INTRAMUSCULAR | Status: DC | PRN
  Administered 2012-12-14: 0.2 mg via INTRAVENOUS

## 2012-12-14 MED ORDER — LACTATED RINGERS IV SOLN
INTRAVENOUS | Status: DC
  Administered 2012-12-14 (×2): via INTRAVENOUS
  Filled 2012-12-14: qty 1000

## 2012-12-14 MED ORDER — FENTANYL CITRATE 0.05 MG/ML IJ SOLN
25.0000 ug | INTRAMUSCULAR | Status: DC | PRN
  Filled 2012-12-14: qty 1

## 2012-12-14 MED ORDER — FENTANYL CITRATE 0.05 MG/ML IJ SOLN
INTRAMUSCULAR | Status: DC | PRN
  Administered 2012-12-14: 50 ug via INTRAVENOUS
  Administered 2012-12-14 (×2): 25 ug via INTRAVENOUS

## 2012-12-14 MED ORDER — PROMETHAZINE HCL 25 MG/ML IJ SOLN
6.2500 mg | INTRAMUSCULAR | Status: DC | PRN
  Filled 2012-12-14: qty 1

## 2012-12-14 MED ORDER — PROPOFOL 10 MG/ML IV BOLUS
INTRAVENOUS | Status: DC | PRN
  Administered 2012-12-14: 170 mg via INTRAVENOUS

## 2012-12-14 MED ORDER — DEXAMETHASONE SODIUM PHOSPHATE 4 MG/ML IJ SOLN
INTRAMUSCULAR | Status: DC | PRN
  Administered 2012-12-14: 10 mg via INTRAVENOUS

## 2012-12-14 SURGICAL SUPPLY — 40 items
ADAPTER CATH URET PLST 4-6FR (CATHETERS) IMPLANT
ADPR CATH URET STRL DISP 4-6FR (CATHETERS)
BAG DRAIN URO-CYSTO SKYTR STRL (DRAIN) ×4 IMPLANT
BAG DRN UROCATH (DRAIN) ×3
BASKET LASER NITINOL 1.9FR (BASKET) IMPLANT
BASKET STNLS GEMINI 4WIRE 3FR (BASKET) IMPLANT
BASKET ZERO TIP NITINOL 2.4FR (BASKET) ×3 IMPLANT
BRUSH URET BIOPSY 3F (UROLOGICAL SUPPLIES) IMPLANT
BSKT STON RTRVL 120 1.9FR (BASKET)
BSKT STON RTRVL GEM 120X11 3FR (BASKET)
BSKT STON RTRVL ZERO TP 2.4FR (BASKET) ×3
CANISTER SUCT LVC 12 LTR MEDI- (MISCELLANEOUS) ×3 IMPLANT
CATH INTERMIT  6FR 70CM (CATHETERS) ×3 IMPLANT
CATH URET 5FR 28IN CONE TIP (BALLOONS)
CATH URET 5FR 70CM CONE TIP (BALLOONS) IMPLANT
CLOTH BEACON ORANGE TIMEOUT ST (SAFETY) ×4 IMPLANT
DRAPE CAMERA CLOSED 9X96 (DRAPES) ×4 IMPLANT
ELECT REM PT RETURN 9FT ADLT (ELECTROSURGICAL)
ELECTRODE REM PT RTRN 9FT ADLT (ELECTROSURGICAL) IMPLANT
FIBER LASER FLEXIVA 200 (UROLOGICAL SUPPLIES) IMPLANT
FIBER LASER FLEXIVA 365 (UROLOGICAL SUPPLIES) IMPLANT
FIBER LASER FLEXIVA 550 (UROLOGICAL SUPPLIES) IMPLANT
GLOVE BIO SURGEON STRL SZ8 (GLOVE) ×4 IMPLANT
GLOVE INDICATOR 7.5 STRL GRN (GLOVE) ×6 IMPLANT
GOWN PREVENTION PLUS LG XLONG (DISPOSABLE) ×1 IMPLANT
GOWN STRL REIN XL XLG (GOWN DISPOSABLE) ×7 IMPLANT
GUIDEWIRE 0.038 PTFE COATED (WIRE) IMPLANT
GUIDEWIRE ANG ZIPWIRE 038X150 (WIRE) IMPLANT
GUIDEWIRE STR DUAL SENSOR (WIRE) ×4 IMPLANT
IV NS IRRIG 3000ML ARTHROMATIC (IV SOLUTION) ×8 IMPLANT
KIT BALLIN UROMAX 15FX10 (LABEL) IMPLANT
KIT BALLN UROMAX 15FX4 (MISCELLANEOUS) IMPLANT
KIT BALLN UROMAX 26 75X4 (MISCELLANEOUS)
PACK CYSTOSCOPY (CUSTOM PROCEDURE TRAY) ×4 IMPLANT
SET HIGH PRES BAL DIL (LABEL)
SHEATH ACCESS URETERAL 38CM (SHEATH) ×3 IMPLANT
SHEATH ACCESS URETERAL 54CM (SHEATH) IMPLANT
SHEATH URET ACCESS 12FR/35CM (UROLOGICAL SUPPLIES) IMPLANT
SHEATH URET ACCESS 12FR/55CM (UROLOGICAL SUPPLIES) IMPLANT
WATER STERILE IRR 3000ML UROMA (IV SOLUTION) IMPLANT

## 2012-12-14 NOTE — Anesthesia Postprocedure Evaluation (Signed)
  Anesthesia Post-op Note  Patient: Gina Branch  Procedure(s) Performed: Procedure(s) (LRB): CYSTOSCOPY WITH RETROGRADE PYELOGRAM (Left) CYSTOSCOPY WITH LEFT URETEROSCOPY AND STENT PLACEMENT, BASKET STONE EXTRACTION (Left)  Patient Location: PACU  Anesthesia Type: General  Level of Consciousness: awake and alert   Airway and Oxygen Therapy: Patient Spontanous Breathing  Post-op Pain: mild  Post-op Assessment: Post-op Vital signs reviewed, Patient's Cardiovascular Status Stable, Respiratory Function Stable, Patent Airway and No signs of Nausea or vomiting  Last Vitals:  Filed Vitals:   12/14/12 1000  BP: 150/66  Pulse: 89  Temp:   Resp: 15    Post-op Vital Signs: stable   Complications: No apparent anesthesia complications

## 2012-12-14 NOTE — Anesthesia Procedure Notes (Signed)
Procedure Name: LMA Insertion Date/Time: 12/14/2012 7:31 AM Performed by: Renella Cunas D Pre-anesthesia Checklist: Patient identified, Emergency Drugs available, Suction available and Patient being monitored Patient Re-evaluated:Patient Re-evaluated prior to inductionOxygen Delivery Method: Circle System Utilized Preoxygenation: Pre-oxygenation with 100% oxygen Intubation Type: IV induction Ventilation: Mask ventilation without difficulty LMA: LMA inserted LMA Size: 4.0 Number of attempts: 1 Airway Equipment and Method: bite block Placement Confirmation: positive ETCO2 Tube secured with: Tape Dental Injury: Teeth and Oropharynx as per pre-operative assessment

## 2012-12-14 NOTE — Op Note (Signed)
PATIENT:  Joneen Roach  PRE-OPERATIVE DIAGNOSIS: 1.  left Ureteral calculus 2. Left lower pole renal calculus  POST-OPERATIVE DIAGNOSIS: Same  PROCEDURE:  1. Cystoscopy with left retrograde pyelogram including interpretation. 2. Left ureteroscopy and basket extraction of left ureteral stone. 3. Left ureteroscopy and laser lithotripsy of left lower pole renal calculus. 4. Left double-J stent placement  SURGEON: Garnett Farm, MD  INDICATION: Mrs. Wirick is a 67 year old female who experienced hematuria and was evaluated with upper tract studies using CT scan. This revealed a 4 mm stone just below the left UPJ. There were 3 other small stones in the periphery of the kidney and a large, 10 mm stone lower pole. She is brought to the operating room today for management of her left ureteral and possibly renal calculi.  ANESTHESIA:  General  EBL:  Minimal  DRAINS: 6 French, 24 cm double-J stent (no string)  SPECIMEN:  Stone given the patient  DESCRIPTION OF PROCEDURE: The patient was taken to the major OR and placed on the table. General anesthesia was administered and then the patient was moved to the dorsal lithotomy position. The genitalia was sterilely prepped and draped. An official timeout was performed.  Initially the 22 French cystoscope with 12 lens was passed under direct vision. The bladder was then entered and fully inspected. It was noted be free of any tumors stones or inflammatory lesions. Ureteral orifices were of normal configuration and position. A 6 French open-ended ureteral catheter was then passed through the cystoscope into the ureteral orifice in order to perform a left retrograde pyelogram.  A retrograde pyelogram was performed by injecting full-strength contrast up the left ureter under direct fluoroscopic control. It revealed a filling defect in the ureter consistent with the stone seen on the preoperative KUB. The remainder of the ureter was noted to be normal as  was the intrarenal collecting system. The stone in the lower pole was identified as a filling defect on the study. I then passed a 0.038 inch floppy-tipped guidewire through the open ended catheter and into the area of the renal pelvis and this was left in place. The inner portion of a ureteral access sheath was then passed over the guidewire to gently dilate the intramural ureter. I then proceeded with ureteroscopy.  A 6 French rigid ureteroscope was then passed under direct into the bladder and into the left orifice and up the ureter. The stone was identified and I felt that it could be extracted so I passed a Nitinol basket through the ureteroscope and attempted to engage the stone. It migrated proximally into the kidney and I therefore advanced the scope into the area the renal pelvis. I was able to identify the stone but it was cloudy and this made it difficult to easily visualize the stone so elected to place a ureteral access sheath. I therefore passed a guidewire through the ureteroscope, removed the ureteroscope and passed the access sheath over the guidewire into the area the renal pelvis under fluoroscopic control. I then removed the inner portion of the access sheath and passed the flexible ureteroscope through the access sheath into the or the renal pelvis. The stone was identified and it was grasped with a Nitinol basket. I was able to then advanced the scope and the stone down the access sheath.  Because of her history of her remaining renal calculi and I elected to evaluate the stones as the access sheath was in place and this allowed me to visualize the stones  that were adherent to a renal papilla. Although the small stones appeared to be adherent there was a very large stone in the lower pole and I felt treatment of the stone was indicated as it had a low probability of spontaneous passage if it were began to pass down the ureter. I engaged the stone in the lower pole with a Nitinol basket but  it was somewhat difficult to manipulate the stone from this location because it was in the lower pole. I therefore used the holmium laser to fragment the stone as I was unable to disengage the Nitinol basket.  The 200  holmium laser fiber was used to fragment the stone. And this allowed me to disengage the Nitinol basket without difficulty. There were still some stone fragments present and I elected to place a stent and determine if these needed any further form of therapy on followup. I then backloaded the cystoscope over the guidewire and passed the stent over the guidewire into the area of the renal pelvis. As the guidewire was removed good curl was noted in the renal pelvis. The bladder was drained and the cystoscope was then removed. The patient tolerated the procedure well no intraoperative complications.  PLAN OF CARE: Discharge to home after PACU  PATIENT DISPOSITION:  PACU - hemodynamically stable.

## 2012-12-14 NOTE — Interval H&P Note (Signed)
History and Physical Interval Note:  12/14/2012 7:10 AM  Gina Branch  has presented today for surgery, with the diagnosis of LEFT URETERAL STONE   The various methods of treatment have been discussed with the patient and family. After consideration of risks, benefits and other options for treatment, the patient has consented to  Procedure(s): LEFT URETEROSCOPY WITH LASER LITHO    (Left) HOLMIUM LASER APPLICATION (N/A) as a surgical intervention .  The patient's history has been reviewed, patient examined, no change in status, stable for surgery.  I have reviewed the patient's chart and labs.  Questions were answered to the patient's satisfaction.     Garnett Farm

## 2012-12-14 NOTE — Transfer of Care (Signed)
Immediate Anesthesia Transfer of Care Note  Patient: Gina Branch  Procedure(s) Performed: Procedure(s) (LRB): CYSTOSCOPY WITH RETROGRADE PYELOGRAM (Left) CYSTOSCOPY WITH LEFT URETEROSCOPY AND STENT PLACEMENT, BASKET STONE EXTRACTION (Left)  Patient Location: PACU  Anesthesia Type: General  Level of Consciousness: awake, oriented, sedated and patient cooperative  Airway & Oxygen Therapy: Patient Spontanous Breathing and Patient connected to face mask oxygen  Post-op Assessment: Report given to PACU RN and Post -op Vital signs reviewed and stable  Post vital signs: Reviewed and stable  Complications: No apparent anesthesia complications

## 2012-12-17 ENCOUNTER — Encounter (HOSPITAL_BASED_OUTPATIENT_CLINIC_OR_DEPARTMENT_OTHER): Payer: Self-pay | Admitting: Urology

## 2012-12-26 ENCOUNTER — Other Ambulatory Visit: Payer: Self-pay | Admitting: Urology

## 2013-01-04 ENCOUNTER — Encounter (HOSPITAL_COMMUNITY): Payer: Self-pay | Admitting: Pharmacy Technician

## 2013-01-08 ENCOUNTER — Encounter (HOSPITAL_COMMUNITY): Payer: Self-pay

## 2013-01-08 ENCOUNTER — Encounter (INDEPENDENT_AMBULATORY_CARE_PROVIDER_SITE_OTHER): Payer: Self-pay

## 2013-01-08 ENCOUNTER — Ambulatory Visit (HOSPITAL_COMMUNITY)
Admission: RE | Admit: 2013-01-08 | Discharge: 2013-01-08 | Disposition: A | Discharge status: Home / Self Care | Payer: Medicare Other | Source: Ambulatory Visit | Attending: Urology | Admitting: Urology

## 2013-01-08 ENCOUNTER — Encounter (HOSPITAL_COMMUNITY)
Admission: RE | Admit: 2013-01-08 | Discharge: 2013-01-08 | Disposition: A | Discharge status: Home / Self Care | Payer: Medicare Other | Source: Ambulatory Visit | Attending: Urology | Admitting: Urology

## 2013-01-08 DIAGNOSIS — Z01818 Encounter for other preprocedural examination: Secondary | ICD-10-CM | POA: Insufficient documentation

## 2013-01-08 DIAGNOSIS — Z01812 Encounter for preprocedural laboratory examination: Secondary | ICD-10-CM | POA: Insufficient documentation

## 2013-01-08 DIAGNOSIS — I10 Essential (primary) hypertension: Secondary | ICD-10-CM | POA: Insufficient documentation

## 2013-01-08 DIAGNOSIS — N200 Calculus of kidney: Secondary | ICD-10-CM | POA: Insufficient documentation

## 2013-01-08 DIAGNOSIS — N201 Calculus of ureter: Secondary | ICD-10-CM | POA: Insufficient documentation

## 2013-01-08 DIAGNOSIS — R52 Pain, unspecified: Secondary | ICD-10-CM

## 2013-01-08 DIAGNOSIS — J45909 Unspecified asthma, uncomplicated: Secondary | ICD-10-CM | POA: Insufficient documentation

## 2013-01-08 HISTORY — DX: Pain, unspecified: R52

## 2013-01-08 LAB — BASIC METABOLIC PANEL
BUN: 9 mg/dL (ref 6–23)
CO2: 25 mEq/L (ref 19–32)
Calcium: 9.4 mg/dL (ref 8.4–10.5)
Chloride: 105 mEq/L (ref 96–112)
Creatinine, Ser: 0.61 mg/dL (ref 0.50–1.10)
GFR calc Af Amer: >90 mL/min (ref >90)
GFR calc non Af Amer: >90 mL/min (ref >90)
Glucose, Bld: 110 mg/dL — ABNORMAL HIGH (ref 70–99)
Potassium: 3.8 mEq/L (ref 3.5–5.1)
Sodium: 139 mEq/L (ref 135–145)

## 2013-01-08 LAB — CBC
HCT: 43.7 % (ref 36.0–46.0)
Hemoglobin: 15.3 g/dL — ABNORMAL HIGH (ref 12.0–15.0)
MCH: 31.4 pg (ref 26.0–34.0)
MCHC: 35 g/dL (ref 30.0–36.0)
MCV: 89.5 fL (ref 78.0–100.0)
Platelets: 221 10*3/uL (ref 150–400)
RBC: 4.88 MIL/uL (ref 3.87–5.11)
RDW: 12.1 % (ref 11.5–15.5)
WBC: 5.8 10*3/uL (ref 4.0–10.5)

## 2013-01-08 NOTE — Patient Instructions (Addendum)
20 Gina Branch  01/08/2013   Your procedure is scheduled on: 12-5  -2014  Report to [facility] at      0530  AM.  Call this number if you have problems the morning of surgery: [phone]  Or Presurgical Testing [id number](Ardeth Repetto)      Do not eat food:After Midnight.    Take these medicines the morning of surgery with A SIP OF WATER: Diltiazem.Lipitor. Lexapro.Levothyroxine. Use Albuterol/use as needed. Eye drops as needed.   Do not wear jewelry, make-up or nail polish.  Do not wear lotions, powders, or perfumes. You may wear deodorant.  Do not shave 12 hours prior to first CHG shower(legs and under arms).(face and neck okay.)  Do not bring valuables to the hospital.  Contacts, dentures or removable bridgework, body piercing, hair pins may not be worn into surgery.  Leave suitcase in the car. After surgery it may be brought to your room.  For patients admitted to the hospital, checkout time is 11:00 AM the day of discharge.   Patients discharged the day of surgery will not be allowed to drive home. Must have responsible person with you x 24 hours once discharged.  Name and phone number of your driver: anahi, [place] [phone] h  Special Instructions: CHG(Chlorhedine 4%-"Hibiclens","Betasept","Aplicare") Shower Use Special Wash: see special instructions.(avoid face and genitals)     Remember : Type/Screen "Blue armbands" - may not be removed once applied(would result in being retested if removed).  Failure to follow these instructions may result in Cancellation of your surgery.   Patient signature_______________________________________________________

## 2013-01-08 NOTE — Pre-Procedure Instructions (Signed)
01-08-13 CT abd-10'14-Epic, EKG 11'14 -Epic. CXR today.

## 2013-01-09 ENCOUNTER — Encounter (HOSPITAL_COMMUNITY): Payer: Self-pay | Admitting: Urology

## 2013-01-09 DIAGNOSIS — N200 Calculus of kidney: Secondary | ICD-10-CM

## 2013-01-09 HISTORY — DX: Calculus of kidney: N20.0

## 2013-01-09 NOTE — H&P (Signed)
History of Present Illness     Left nephrolithiasis: A CT scan done 11/14/38 revealed 4 stones in her left kidney the largest measuring 10 mm in the lower pole.  All were nonobstructing with Hounsfield units of ~800.  In addition there was a 4 mm wide stone just below the UPJ on the left side.  The radiologist read this as 6 mm however that his length not the width of the stone. Metabolic workup: Serum studies revealed no abnormality other than a significantly elevated PTH of 103.3 with a normal serum calcium of 9.3.    Interval history: She was found to have a 4 mm stone in her proximal left ureter as well as non-obstructing left renal calculi.  She was placed on medical expulsive therapy. She then underwent ureteroscopy with an attempt to remove all the stones in her left kidney.  Past Medical History Problems  1. History of  Hyperlipidemia 272.4 2. History of  Hypertension 401.9 3. History of  Hypothyroidism 244.9  Surgical History Problems  1. History of  Cesarean Section 2. History of  Excision Of Bartholin's Gland Or Cyst 3. History of  Exploratory Laparoscopy 4. History of  Exploratory Laparotomy 5. History of  Hysterectomy V45.77 6. History of  Tubal Ligation V25.2  Current Meds 1. Albuterol Sulfate (2.5 MG/3ML) 0.083% Inhalation Nebulization Solution; Therapy:  (Recorded:09Oct2014) to 2. Aspir-81 81 MG Oral Tablet Delayed Release; Therapy: (Recorded:09Oct2014) to 3. CoQ10 200 MG Oral Capsule; Therapy: (Recorded:09Oct2014) to 4. Diltiazem HCl ER 180 MG Oral Capsule Extended Release 24 Hour; Therapy:  (Recorded:09Oct2014) to 5. Fish Oil Concentrate 1000 MG Oral Capsule; Therapy: (Recorded:09Oct2014) to 6. Lexapro 10 MG Oral Tablet; Therapy: (Recorded:09Oct2014) to 7. Lipitor 10 MG Oral Tablet; Therapy: (Recorded:09Oct2014) to 8. Ondansetron HCl 4 MG Oral Tablet; Therapy: (Recorded:09Oct2014) to 9. Roxicet 5-325 MG Oral Tablet; Therapy: (Recorded:09Oct2014) to 10. Synthroid 100  MCG Oral Tablet; Therapy: (Recorded:09Oct2014) to 11. Tamsulosin HCl 0.4 MG Oral Capsule; Therapy: (Recorded:09Oct2014) to 12. Vitamin C 1000 MG Oral Tablet; Therapy: (Recorded:09Oct2014) to 13. Vitamin D 1000 UNIT Oral Tablet; Therapy: (Recorded:09Oct2014) to  Allergies Medication  1. Sulfa Drugs  Family History Problems  1. Family history of  Diabetes Mellitus V18.0 2. Family history of  Hyperlipidemia 3. Family history of  Hypertension V17.49 4. Maternal uncle's history of  Urinary Calculus  Social History Problems  1. Marital History - Currently Married 2. Never A Smoker Denied  3. History of  Alcohol Use  Review of Systems Genitourinary, constitutional, skin, eye, otolaryngeal, hematologic/lymphatic, cardiovascular, pulmonary, endocrine, musculoskeletal, gastrointestinal, neurological and psychiatric system(s) were reviewed and pertinent findings if present are noted.  Genitourinary: urinary urgency, nocturia, incontinence and hematuria.  Gastrointestinal: nausea, vomiting and heartburn.  ENT: sinus problems.  Hematologic/Lymphatic: a tendency to easily bruise.  Musculoskeletal: back pain and joint pain.  Neurological: headache.  Psychiatric: anxiety.   Vitals Vital Signs  Height: 5 ft 3 in Weight: 209 lb  BMI Calculated: 37.02 BSA Calculated: 1.97 Blood Pressure: 138 / 81, Sitting Heart Rate: 85 Respiration: 18  Physical Exam Constitutional: Well nourished and well developed . No acute distress.  ENT:. The ears and nose are normal in appearance.  Neck: The appearance of the neck is normal and no neck mass is present.  Pulmonary: No respiratory distress and normal respiratory rhythm and effort.  Cardiovascular: Heart rate and rhythm are normal . No peripheral edema.  Abdomen: The abdomen is mildly obese. The abdomen is soft and nontender. No masses are palpated. No CVA  tenderness. No hernias are palpable. No hepatosplenomegaly noted.  Lymphatics: The femoral and  inguinal nodes are not enlarged or tender.  Skin: Normal skin turgor, no visible rash and no visible skin lesions.  Neuro/Psych:. Mood and affect are appropriate.   The following images/tracing/specimen were independently visualized: Marland Kitchen KUB: at least 2 remaining LLP stones and one possible left UPJ stone, left stent in good position, right LP stone seen   Impression: Her KUB reveals some stones in the kidney as well as at the UPJ. I told her that at the time of her procedure my hope was that this stones, once broken up with fall back into a location where they were all in close proximity and therefore could be potentially treated with lithotripsy however this is not the case. I therefore have recommended repeat ureteroscopy and laser lithotripsy with an attempt to clear her of all of her stones. She is in agreement with this plan.   Plan: Left ureteroscopy with planed removal of remaining left renal calculi.

## 2013-01-11 ENCOUNTER — Encounter (HOSPITAL_COMMUNITY): Payer: Self-pay | Admitting: *Deleted

## 2013-01-11 ENCOUNTER — Encounter (HOSPITAL_COMMUNITY)
Admission: RE | Disposition: A | Discharge status: Home / Self Care | Payer: Self-pay | Source: Ambulatory Visit | Attending: Urology

## 2013-01-11 ENCOUNTER — Ambulatory Visit (HOSPITAL_COMMUNITY): Payer: Medicare Other

## 2013-01-11 ENCOUNTER — Ambulatory Visit (HOSPITAL_COMMUNITY): Payer: Medicare Other | Admitting: Certified Registered Nurse Anesthetist

## 2013-01-11 ENCOUNTER — Ambulatory Visit (HOSPITAL_COMMUNITY)
Admission: RE | Admit: 2013-01-11 | Discharge: 2013-01-11 | Disposition: A | Discharge status: Home / Self Care | Payer: Medicare Other | Source: Ambulatory Visit | Attending: Urology | Admitting: Urology

## 2013-01-11 ENCOUNTER — Encounter (HOSPITAL_COMMUNITY): Payer: Medicare Other | Admitting: Certified Registered Nurse Anesthetist

## 2013-01-11 DIAGNOSIS — I10 Essential (primary) hypertension: Secondary | ICD-10-CM | POA: Insufficient documentation

## 2013-01-11 DIAGNOSIS — E039 Hypothyroidism, unspecified: Secondary | ICD-10-CM | POA: Insufficient documentation

## 2013-01-11 DIAGNOSIS — E785 Hyperlipidemia, unspecified: Secondary | ICD-10-CM | POA: Insufficient documentation

## 2013-01-11 DIAGNOSIS — N200 Calculus of kidney: Secondary | ICD-10-CM | POA: Insufficient documentation

## 2013-01-11 DIAGNOSIS — Z79899 Other long term (current) drug therapy: Secondary | ICD-10-CM | POA: Insufficient documentation

## 2013-01-11 DIAGNOSIS — K219 Gastro-esophageal reflux disease without esophagitis: Secondary | ICD-10-CM | POA: Insufficient documentation

## 2013-01-11 HISTORY — PX: CYSTOSCOPY WITH URETEROSCOPY AND STENT PLACEMENT: SHX6377

## 2013-01-11 HISTORY — PX: HOLMIUM LASER APPLICATION: SHX5852

## 2013-01-11 SURGERY — CYSTOURETEROSCOPY, WITH STENT INSERTION
Anesthesia: General | Laterality: Left

## 2013-01-11 MED ORDER — ONDANSETRON HCL 4 MG/2ML IJ SOLN
INTRAMUSCULAR | Status: AC
  Filled 2013-01-11: qty 2

## 2013-01-11 MED ORDER — OXYCODONE-ACETAMINOPHEN 10-325 MG PO TABS: 1.0000 | ORAL_TABLET | ORAL | Status: AC | PRN

## 2013-01-11 MED ORDER — MIDAZOLAM HCL 2 MG/2ML IJ SOLN
INTRAMUSCULAR | Status: AC
  Filled 2013-01-11: qty 2

## 2013-01-11 MED ORDER — PROMETHAZINE HCL 25 MG/ML IJ SOLN: 6.2500 mg | INTRAMUSCULAR | Status: DC | PRN

## 2013-01-11 MED ORDER — ONDANSETRON HCL 4 MG/2ML IJ SOLN
INTRAMUSCULAR | Status: DC | PRN
  Administered 2013-01-11: 4 mg via INTRAVENOUS

## 2013-01-11 MED ORDER — PROPOFOL 10 MG/ML IV BOLUS
INTRAVENOUS | Status: DC | PRN
  Administered 2013-01-11: 150 mg via INTRAVENOUS

## 2013-01-11 MED ORDER — TOLTERODINE TARTRATE ER 4 MG PO CP24: 4.0000 mg | ORAL_CAPSULE | Freq: Every day | ORAL | Status: DC

## 2013-01-11 MED ORDER — OXYCODONE-ACETAMINOPHEN 10-325 MG PO TABS: 1.0000 | ORAL_TABLET | ORAL | Status: DC | PRN

## 2013-01-11 MED ORDER — PROPOFOL 10 MG/ML IV BOLUS
INTRAVENOUS | Status: AC
  Filled 2013-01-11: qty 20

## 2013-01-11 MED ORDER — EPHEDRINE SULFATE 50 MG/ML IJ SOLN
INTRAMUSCULAR | Status: AC
  Filled 2013-01-11: qty 1

## 2013-01-11 MED ORDER — LACTATED RINGERS IV SOLN: INTRAVENOUS | Status: DC

## 2013-01-11 MED ORDER — PHENAZOPYRIDINE HCL 200 MG PO TABS: 200.0000 mg | ORAL_TABLET | Freq: Three times a day (TID) | ORAL | Status: DC | PRN

## 2013-01-11 MED ORDER — PHENAZOPYRIDINE HCL 200 MG PO TABS
ORAL_TABLET | ORAL | Status: AC
  Filled 2013-01-11: qty 1

## 2013-01-11 MED ORDER — DEXAMETHASONE SODIUM PHOSPHATE 10 MG/ML IJ SOLN
INTRAMUSCULAR | Status: AC
  Filled 2013-01-11: qty 1

## 2013-01-11 MED ORDER — CIPROFLOXACIN IN D5W 200 MG/100ML IV SOLN
200.0000 mg | INTRAVENOUS | Status: AC
  Administered 2013-01-11: 200 mg via INTRAVENOUS
  Filled 2013-01-11: qty 100

## 2013-01-11 MED ORDER — PHENAZOPYRIDINE HCL 200 MG PO TABS: 200.0000 mg | ORAL_TABLET | Freq: Three times a day (TID) | ORAL | Status: AC | PRN

## 2013-01-11 MED ORDER — KETOROLAC TROMETHAMINE 30 MG/ML IJ SOLN
INTRAMUSCULAR | Status: AC
  Filled 2013-01-11: qty 1

## 2013-01-11 MED ORDER — SODIUM CHLORIDE 0.9 % IJ SOLN
INTRAMUSCULAR | Status: AC
  Filled 2013-01-11: qty 10

## 2013-01-11 MED ORDER — EPHEDRINE SULFATE 50 MG/ML IJ SOLN
INTRAMUSCULAR | Status: DC | PRN
  Administered 2013-01-11: 5 mg via INTRAVENOUS

## 2013-01-11 MED ORDER — FENTANYL CITRATE 0.05 MG/ML IJ SOLN
INTRAMUSCULAR | Status: AC
  Filled 2013-01-11: qty 2

## 2013-01-11 MED ORDER — DEXAMETHASONE SODIUM PHOSPHATE 10 MG/ML IJ SOLN
INTRAMUSCULAR | Status: DC | PRN
  Administered 2013-01-11: 10 mg via INTRAVENOUS

## 2013-01-11 MED ORDER — LACTATED RINGERS IV SOLN
INTRAVENOUS | Status: DC | PRN
  Administered 2013-01-11 (×2): via INTRAVENOUS

## 2013-01-11 MED ORDER — TOLTERODINE TARTRATE ER 4 MG PO CP24: 4.0000 mg | ORAL_CAPSULE | Freq: Every day | ORAL | Status: AC

## 2013-01-11 MED ORDER — SODIUM CHLORIDE 0.9 % IR SOLN
Status: DC | PRN
  Administered 2013-01-11: 3000 mL via INTRAVESICAL

## 2013-01-11 MED ORDER — PHENAZOPYRIDINE HCL 200 MG PO TABS: 200.0000 mg | ORAL_TABLET | Freq: Once | ORAL | Status: DC

## 2013-01-11 MED ORDER — MIDAZOLAM HCL 5 MG/5ML IJ SOLN
INTRAMUSCULAR | Status: DC | PRN
  Administered 2013-01-11: 2 mg via INTRAVENOUS

## 2013-01-11 MED ORDER — OXYBUTYNIN CHLORIDE 5 MG PO TABS
5.0000 mg | ORAL_TABLET | Freq: Once | ORAL | Status: AC
  Administered 2013-01-11: 5 mg via ORAL

## 2013-01-11 MED ORDER — FENTANYL CITRATE 0.05 MG/ML IJ SOLN
INTRAMUSCULAR | Status: DC | PRN
  Administered 2013-01-11: 50 ug via INTRAVENOUS

## 2013-01-11 MED ORDER — OXYBUTYNIN CHLORIDE 5 MG PO TABS
ORAL_TABLET | ORAL | Status: AC
  Filled 2013-01-11: qty 1

## 2013-01-11 MED ORDER — FENTANYL CITRATE 0.05 MG/ML IJ SOLN: 25.0000 ug | INTRAMUSCULAR | Status: DC | PRN

## 2013-01-11 MED ORDER — KETOROLAC TROMETHAMINE 30 MG/ML IJ SOLN
15.0000 mg | Freq: Once | INTRAMUSCULAR | Status: AC | PRN
  Administered 2013-01-11: 30 mg via INTRAVENOUS

## 2013-01-11 SURGICAL SUPPLY — 16 items
BAG URO CATCHER STRL LF (DRAPE) ×2 IMPLANT
BASKET ZERO TIP NITINOL 2.4FR (BASKET) ×1 IMPLANT
BSKT STON RTRVL ZERO TP 2.4FR (BASKET) ×1
DRAPE CAMERA CLOSED 9X96 (DRAPES) ×2 IMPLANT
FIBER LASER FLEXIVA 200 (UROLOGICAL SUPPLIES) ×1 IMPLANT
GLOVE BIO SURGEON STRL SZ8 (GLOVE) ×1 IMPLANT
GLOVE SURG SS PI 8.0 STRL IVOR (GLOVE) ×1 IMPLANT
GOWN PREVENTION PLUS XLARGE (GOWN DISPOSABLE) ×1 IMPLANT
GOWN STRL REIN XL XLG (GOWN DISPOSABLE) ×2 IMPLANT
MANIFOLD NEPTUNE II (INSTRUMENTS) ×2 IMPLANT
MARKER SKIN DUAL TIP RULER LAB (MISCELLANEOUS) ×1 IMPLANT
MASK EYE SHIELD (GAUZE/BANDAGES/DRESSINGS) ×1 IMPLANT
PACK CYSTO (CUSTOM PROCEDURE TRAY) ×2 IMPLANT
SHEATH ACCESS URETERAL 38CM (SHEATH) ×1 IMPLANT
STENT CONTOUR 6FRX24X.038 (STENTS) ×1 IMPLANT
TUBING CONNECTING 10 (TUBING) ×2 IMPLANT

## 2013-01-11 NOTE — Anesthesia Preprocedure Evaluation (Signed)
Anesthesia Evaluation  Patient identified by MRN, date of birth, ID band Patient awake    Reviewed: Allergy & Precautions, H&P , NPO status , Patient's Chart, lab work & pertinent test results  Airway Mallampati: II TM Distance: >3 FB Neck ROM: Full    Dental no notable dental hx.    Pulmonary neg pulmonary ROS,  breath sounds clear to auscultation  Pulmonary exam normal       Cardiovascular hypertension, Pt. on medications Rhythm:Regular Rate:Normal     Neuro/Psych negative neurological ROS  negative psych ROS   GI/Hepatic Neg liver ROS, GERD-  Medicated,  Endo/Other  Hypothyroidism   Renal/GU negative Renal ROS  negative genitourinary   Musculoskeletal negative musculoskeletal ROS (+)   Abdominal   Peds negative pediatric ROS (+)  Hematology negative hematology ROS (+)   Anesthesia Other Findings   Reproductive/Obstetrics negative OB ROS                           Anesthesia Physical Anesthesia Plan  ASA: II  Anesthesia Plan: General   Post-op Pain Management:    Induction: Intravenous  Airway Management Planned: LMA  Additional Equipment:   Intra-op Plan:   Post-operative Plan:   Informed Consent: I have reviewed the patients History and Physical, chart, labs and discussed the procedure including the risks, benefits and alternatives for the proposed anesthesia with the patient or authorized representative who has indicated his/her understanding and acceptance.   Dental advisory given  Plan Discussed with: CRNA and Surgeon  Anesthesia Plan Comments:         Anesthesia Quick Evaluation

## 2013-01-11 NOTE — Interval H&P Note (Signed)
History and Physical Interval Note:  01/11/2013 7:23 AM  Gina Branch  has presented today for surgery, with the diagnosis of LEFT URETERAL STONES AND RENAL STONES  The various methods of treatment have been discussed with the patient and family. After consideration of risks, benefits and other options for treatment, the patient has consented to  Procedure(s): LEFT  URETEROSCOPY AND STENT PLACEMENT (Left) HOLMIUM LASER LITHO  (Left) as a surgical intervention .  The patient's history has been reviewed, patient examined, no change in status, stable for surgery.  I have reviewed the patient's chart and labs.  Questions were answered to the patient's satisfaction.     Garnett Farm

## 2013-01-11 NOTE — Transfer of Care (Signed)
Immediate Anesthesia Transfer of Care Note  Patient: Gina Branch  Procedure(s) Performed: Procedure(s) with comments: LEFT  URETEROSCOPY AND STENT EXCHANGE (Left) HOLMIUM LASER LITHO  (Left) - with basket extraction of stones  Patient Location: PACU  Anesthesia Type:General  Level of Consciousness: awake, sedated and patient cooperative  Airway & Oxygen Therapy: Patient Spontanous Breathing and Patient connected to face mask oxygen  Post-op Assessment: Report given to PACU RN and Post -op Vital signs reviewed and stable  Post vital signs: Reviewed and stable  Complications: No apparent anesthesia complications

## 2013-01-11 NOTE — Anesthesia Postprocedure Evaluation (Signed)
  Anesthesia Post-op Note  Patient: Gina Branch  Procedure(s) Performed: Procedure(s) (LRB): LEFT  URETEROSCOPY AND STENT EXCHANGE (Left) HOLMIUM LASER LITHO  (Left)  Patient Location: PACU  Anesthesia Type: General  Level of Consciousness: awake and alert   Airway and Oxygen Therapy: Patient Spontanous Breathing  Post-op Pain: mild  Post-op Assessment: Post-op Vital signs reviewed, Patient's Cardiovascular Status Stable, Respiratory Function Stable, Patent Airway and No signs of Nausea or vomiting  Last Vitals:  Filed Vitals:   01/11/13 1000  BP: 141/67  Pulse: 73  Temp:   Resp: 12    Post-op Vital Signs: stable   Complications: No apparent anesthesia complications

## 2013-01-11 NOTE — Op Note (Signed)
PATIENT:  Gina Branch  PRE-OPERATIVE DIAGNOSIS: left renal calculi  POST-OPERATIVE DIAGNOSIS: Same  PROCEDURE:  1. Cystoscopy with removal of stent. 2. Left ureteroscopy, laser lithotripsy and stone extraction. 3. Left double-J stent placement.  SURGEON: Garnett Farm, MD  INDICATION: [name] is a 67 year old female who initially had renal colic do to a left UPJ stone. She also had other renal calculi. I treated her UPJ stone previously and attempted to treat the stones primarily located in the lower pole of her kidney however there size and number were such that I was only able to partially fragment the stones initially. She is brought back to the operating room for further treatment of her remaining left renal calculi. She has a stent indwelling on the left-hand side.  ANESTHESIA:  General  EBL: minimal  DRAINS: 6 French, 24 cm double-J stent in the left ureter (with string)  SPECIMEN:  Specimen given to patient.  DESCRIPTION OF PROCEDURE: The patient was taken to the major OR and placed on the table. General anesthesia was administered and then the patient was moved to the dorsal lithotomy position. The genitalia was sterilely prepped and draped. An official timeout was performed.  Initially the 22 French cystoscope with 12 lens was passed under direct vision. The bladder was then entered and fully inspected. It was noted be free of any tumors stones or inflammatory lesions. Ureteral orifices were of normal configuration and position. I identified the stent exiting the left ureteral orifice and grasped this with alligator forceps. It was withdrawn through the urethral meatus and a 0.038 inch floppy-tipped guidewire was then passed through the stent and up into the area the renal pelvis under direct fluoroscopic guidance.  Over the guidewire and passed a ureteral access sheath and then used the flexible, digital ureteroscope which was passed through the access sheath and into the  area the renal pelvis. I identified a stone in the kidney that appeared to be small enough to be extracted and therefore grasped it with a Nitinol basket and removed it without difficulty. There were several stones in the lower pole and I grasped the largest stone with a Nitinol basket and removed it from the lower pole to the renal pelvis where I used the holmium laser to fragment the stone. A 200  holmium laser fiber was used to perform fragmentation of the stone. I was able to fragment the stone into submillimeter fragments. I then grasped further stone fragments from the lower pole with a Nitinol basket and removed these. A final large fragment, which was embedded in the wall of the calyx, was freed and grasped. It was pulled to the level of the UPJ and fragmented with the laser and removed.  I passed a guidewire through the access sheath and left this in place. I then removed the access sheath.I then backloaded the cystoscope over the guidewire and passed the stent over the guidewire into the area of the renal pelvis. As the guidewire was removed good curl was noted in the renal pelvis. The bladder was drained and the cystoscope was then removed. The patient tolerated the procedure well no intraoperative complications.  I left the string affixed to the distal aspect of the stent and cut it off short so it could exit the urethra similar to the string of a tampon for removal in the office.  PLAN OF CARE: Discharge to home after PACU  PATIENT DISPOSITION:  PACU - hemodynamically stable.

## 2013-01-14 ENCOUNTER — Encounter (HOSPITAL_COMMUNITY): Payer: Self-pay | Admitting: Urology

## 2013-03-19 ENCOUNTER — Other Ambulatory Visit (HOSPITAL_COMMUNITY): Payer: Self-pay | Admitting: Family Medicine

## 2013-03-19 DIAGNOSIS — R748 Abnormal levels of other serum enzymes: Secondary | ICD-10-CM

## 2013-03-26 ENCOUNTER — Encounter (HOSPITAL_COMMUNITY): Payer: Medicare Other

## 2013-03-26 ENCOUNTER — Encounter (HOSPITAL_COMMUNITY): Payer: 59

## 2013-04-02 ENCOUNTER — Encounter (HOSPITAL_COMMUNITY): Payer: Medicare Other

## 2013-04-08 ENCOUNTER — Encounter (HOSPITAL_COMMUNITY)
Admission: RE | Admit: 2013-04-08 | Discharge: 2013-04-08 | Disposition: A | Discharge status: Home / Self Care | Payer: Medicare Other | Source: Ambulatory Visit | Attending: Family Medicine | Admitting: Family Medicine

## 2013-04-08 ENCOUNTER — Encounter (HOSPITAL_COMMUNITY): Payer: Self-pay

## 2013-04-08 DIAGNOSIS — R748 Abnormal levels of other serum enzymes: Secondary | ICD-10-CM | POA: Insufficient documentation

## 2013-04-08 MED ORDER — TECHNETIUM TC 99M MEDRONATE IV KIT
25.0000 | PACK | Freq: Once | INTRAVENOUS | Status: AC | PRN
  Administered 2013-04-08: 25 via INTRAVENOUS

## 2014-01-27 ENCOUNTER — Ambulatory Visit (HOSPITAL_COMMUNITY)
Admission: RE | Admit: 2014-01-27 | Discharge: 2014-01-27 | Disposition: A | Discharge status: Home / Self Care | Payer: Medicare Other | Source: Ambulatory Visit | Attending: Family Medicine | Admitting: Family Medicine

## 2014-01-27 ENCOUNTER — Other Ambulatory Visit (HOSPITAL_COMMUNITY): Payer: Self-pay | Admitting: Family Medicine

## 2014-01-27 DIAGNOSIS — J4521 Mild intermittent asthma with (acute) exacerbation: Secondary | ICD-10-CM

## 2014-01-27 DIAGNOSIS — R6889 Other general symptoms and signs: Secondary | ICD-10-CM | POA: Insufficient documentation

## 2014-01-27 DIAGNOSIS — J209 Acute bronchitis, unspecified: Secondary | ICD-10-CM

## 2014-01-27 DIAGNOSIS — R05 Cough: Secondary | ICD-10-CM | POA: Insufficient documentation

## 2014-01-27 DIAGNOSIS — J9811 Atelectasis: Secondary | ICD-10-CM | POA: Insufficient documentation

## 2014-03-17 IMAGING — CR DG ABDOMEN 1V
1 series · 1 of 1 positions shown · non-contrast
Comparison: 12/21/2012 radiograph, 11/13/2012 CT

CLINICAL DATA: Pre lithotripsy, left-sided stone.

EXAM:
ABDOMEN - 1 VIEW

[t abdomen supine]
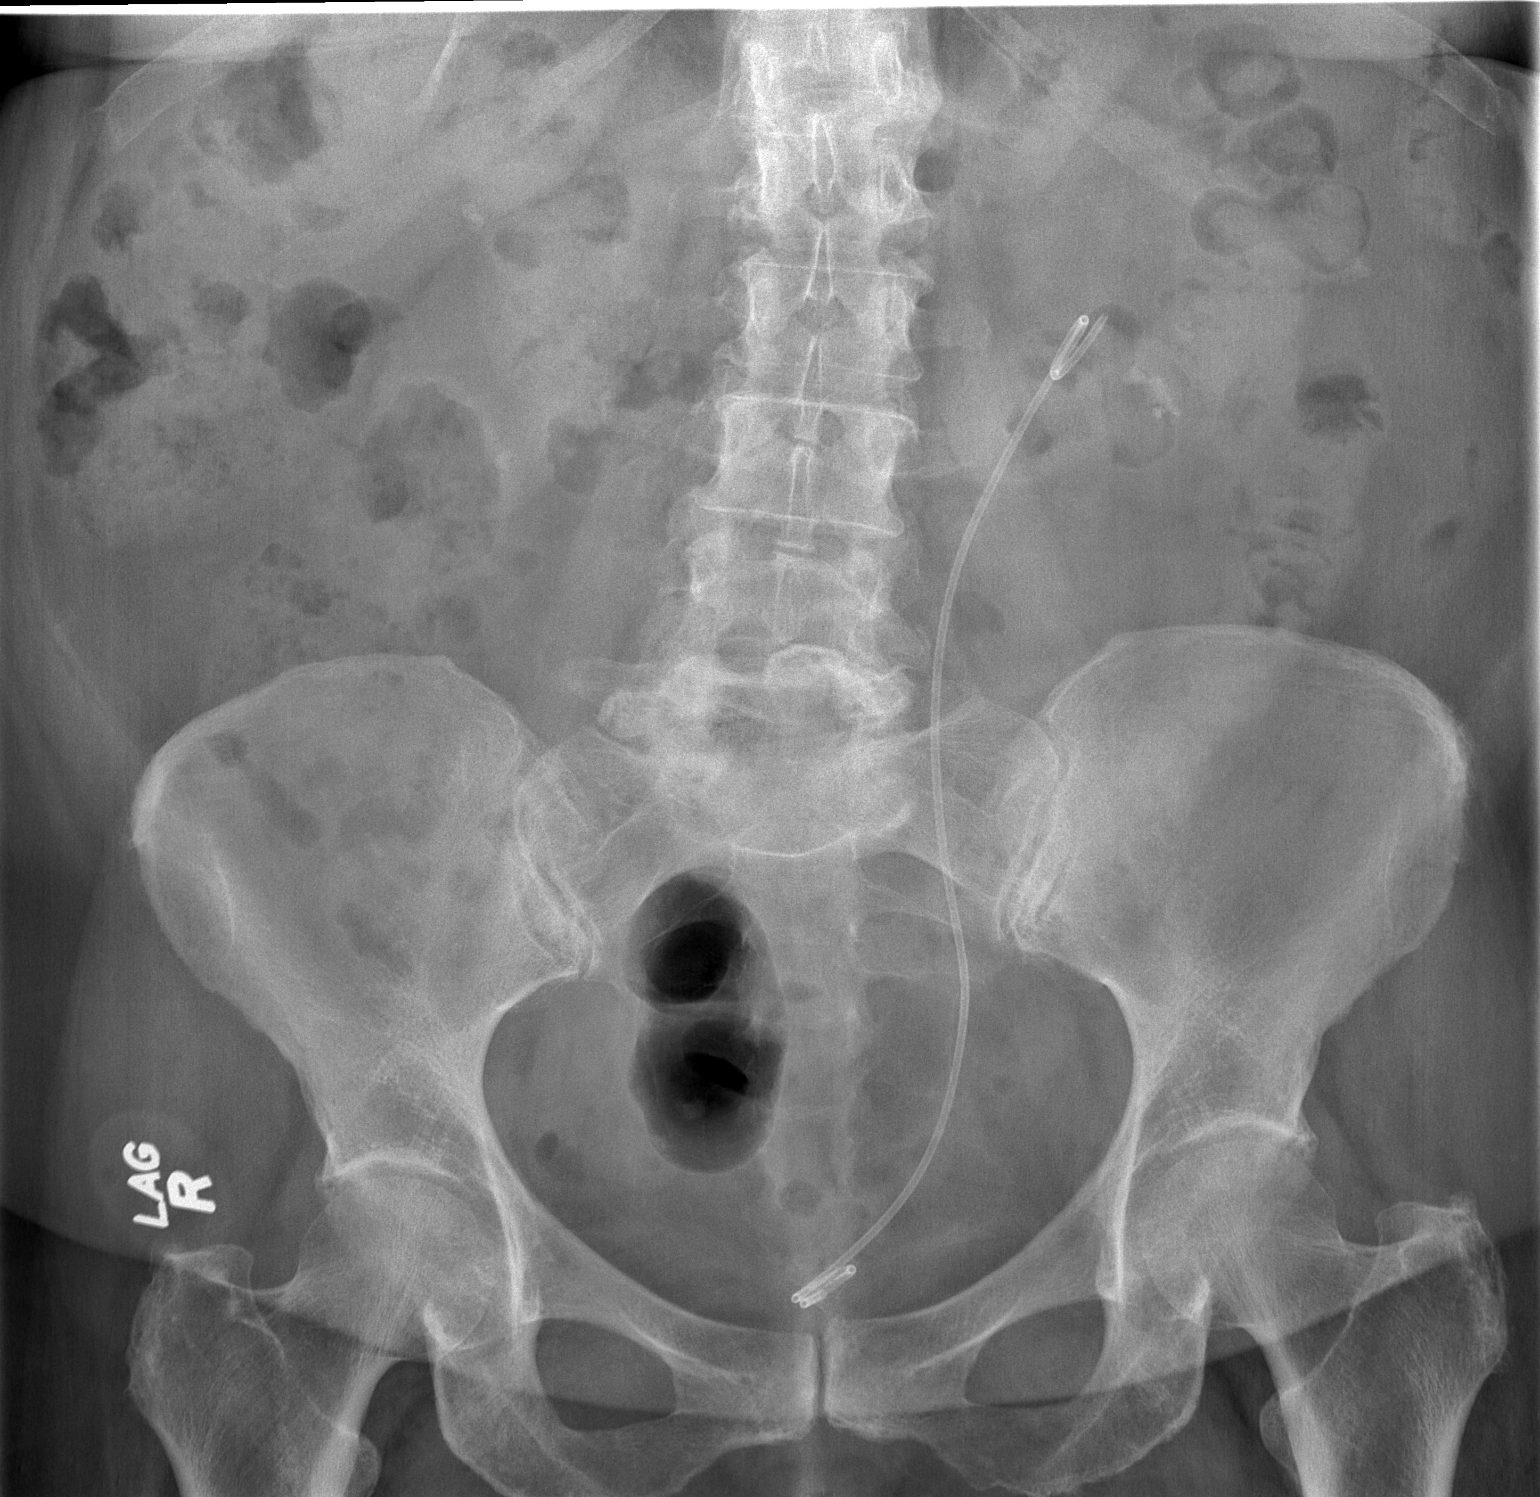

[1 of 1 positions shown; findings below may reference images not displayed]

FINDINGS: Left-sided nephroureteral stent in place. 2 mm stone medial to the
proximal aspect of the stent. Cluster of stones within the lower
pole left kidney, measuring up to 7 mm. Punctate calcifications
projecting over the right upper quadrant/right renal shadow,
confirmed to be extrarenal on prior CT. Bowel gas pattern
nonobstructed. No acute osseous finding.
IMPRESSION: Cluster of stones within the lower pole left kidney.

Left-sided nephroureteral stent and a 2 mm proximal left ureteral
stone.
